# Patient Record
Sex: Female | Born: 1935 | Race: White | Hispanic: No | Marital: Married | Smoking: Former smoker
Health system: Southern US, Community
[De-identification: ages and names within clinical notes are randomized; demographics above are authoritative.]

## PROBLEM LIST (undated history)

## (undated) DIAGNOSIS — M1990 Unspecified osteoarthritis, unspecified site: Secondary | ICD-10-CM

## (undated) DIAGNOSIS — I499 Cardiac arrhythmia, unspecified: Secondary | ICD-10-CM

## (undated) DIAGNOSIS — I10 Essential (primary) hypertension: Secondary | ICD-10-CM

## (undated) DIAGNOSIS — R32 Unspecified urinary incontinence: Secondary | ICD-10-CM

## (undated) DIAGNOSIS — K635 Polyp of colon: Secondary | ICD-10-CM

## (undated) DIAGNOSIS — E039 Hypothyroidism, unspecified: Secondary | ICD-10-CM

## (undated) DIAGNOSIS — K219 Gastro-esophageal reflux disease without esophagitis: Secondary | ICD-10-CM

## (undated) DIAGNOSIS — Z8709 Personal history of other diseases of the respiratory system: Secondary | ICD-10-CM

## (undated) DIAGNOSIS — IMO0001 Reserved for inherently not codable concepts without codable children: Secondary | ICD-10-CM

## (undated) HISTORY — PX: BACK SURGERY: SHX140

## (undated) HISTORY — PX: COLONOSCOPY: SHX174

## (undated) HISTORY — PX: TUMOR REMOVAL: SHX12

## (undated) HISTORY — PX: ESOPHAGOGASTRODUODENOSCOPY: SHX1529

## (undated) HISTORY — PX: CATARACT EXTRACTION: SUR2

---

## 2014-07-12 ENCOUNTER — Other Ambulatory Visit: Payer: Self-pay | Admitting: Neurosurgery

## 2014-07-20 ENCOUNTER — Encounter (HOSPITAL_COMMUNITY)
Admission: RE | Admit: 2014-07-20 | Discharge: 2014-07-20 | Disposition: A | Discharge status: Home / Self Care | Payer: Medicare Other | Source: Ambulatory Visit | Attending: Neurosurgery | Admitting: Neurosurgery

## 2014-07-20 ENCOUNTER — Encounter (HOSPITAL_COMMUNITY): Payer: Self-pay

## 2014-07-20 DIAGNOSIS — M7989 Other specified soft tissue disorders: Secondary | ICD-10-CM | POA: Diagnosis not present

## 2014-07-20 DIAGNOSIS — K219 Gastro-esophageal reflux disease without esophagitis: Secondary | ICD-10-CM | POA: Diagnosis not present

## 2014-07-20 DIAGNOSIS — Z87891 Personal history of nicotine dependence: Secondary | ICD-10-CM | POA: Diagnosis not present

## 2014-07-20 DIAGNOSIS — M5126 Other intervertebral disc displacement, lumbar region: Secondary | ICD-10-CM | POA: Diagnosis not present

## 2014-07-20 DIAGNOSIS — E669 Obesity, unspecified: Secondary | ICD-10-CM | POA: Diagnosis not present

## 2014-07-20 DIAGNOSIS — Z6833 Body mass index (BMI) 33.0-33.9, adult: Secondary | ICD-10-CM | POA: Diagnosis not present

## 2014-07-20 DIAGNOSIS — Z79899 Other long term (current) drug therapy: Secondary | ICD-10-CM | POA: Diagnosis not present

## 2014-07-20 DIAGNOSIS — M1990 Unspecified osteoarthritis, unspecified site: Secondary | ICD-10-CM | POA: Diagnosis not present

## 2014-07-20 DIAGNOSIS — E039 Hypothyroidism, unspecified: Secondary | ICD-10-CM | POA: Diagnosis not present

## 2014-07-20 DIAGNOSIS — R0602 Shortness of breath: Secondary | ICD-10-CM | POA: Diagnosis not present

## 2014-07-20 DIAGNOSIS — I499 Cardiac arrhythmia, unspecified: Secondary | ICD-10-CM | POA: Diagnosis not present

## 2014-07-20 DIAGNOSIS — Z7901 Long term (current) use of anticoagulants: Secondary | ICD-10-CM | POA: Diagnosis not present

## 2014-07-20 DIAGNOSIS — I10 Essential (primary) hypertension: Secondary | ICD-10-CM | POA: Diagnosis not present

## 2014-07-20 DIAGNOSIS — R32 Unspecified urinary incontinence: Secondary | ICD-10-CM | POA: Diagnosis not present

## 2014-07-20 HISTORY — DX: Essential (primary) hypertension: I10

## 2014-07-20 HISTORY — DX: Gastro-esophageal reflux disease without esophagitis: K21.9

## 2014-07-20 HISTORY — DX: Unspecified osteoarthritis, unspecified site: M19.90

## 2014-07-20 HISTORY — DX: Unspecified urinary incontinence: R32

## 2014-07-20 HISTORY — DX: Personal history of other diseases of the respiratory system: Z87.09

## 2014-07-20 HISTORY — DX: Reserved for inherently not codable concepts without codable children: IMO0001

## 2014-07-20 HISTORY — DX: Cardiac arrhythmia, unspecified: I49.9

## 2014-07-20 HISTORY — DX: Hypothyroidism, unspecified: E03.9

## 2014-07-20 HISTORY — DX: Polyp of colon: K63.5

## 2014-07-20 LAB — CBC
HCT: 38 % (ref 36.0–46.0)
Hemoglobin: 12.8 g/dL (ref 12.0–15.0)
MCH: 31 pg (ref 26.0–34.0)
MCHC: 33.7 g/dL (ref 30.0–36.0)
MCV: 92 fL (ref 78.0–100.0)
Platelets: 190 10*3/uL (ref 150–400)
RBC: 4.13 MIL/uL (ref 3.87–5.11)
RDW: 13.8 % (ref 11.5–15.5)
WBC: 4.6 10*3/uL (ref 4.0–10.5)

## 2014-07-20 LAB — BASIC METABOLIC PANEL
Anion gap: 5 (ref 5–15)
BUN: 13 mg/dL (ref 6–20)
CALCIUM: 9.5 mg/dL (ref 8.9–10.3)
CHLORIDE: 107 mmol/L (ref 101–111)
CO2: 29 mmol/L (ref 22–32)
CREATININE: 0.8 mg/dL (ref 0.44–1.00)
GFR calc Af Amer: >60 mL/min (ref >60)
GFR calc non Af Amer: >60 mL/min (ref >60)
GLUCOSE: 97 mg/dL (ref 65–99)
Potassium: 4.3 mmol/L (ref 3.5–5.1)
Sodium: 141 mmol/L (ref 135–145)

## 2014-07-20 LAB — SURGICAL PCR SCREEN
MRSA, PCR: NEGATIVE
Staphylococcus aureus: NEGATIVE

## 2014-07-20 NOTE — Pre-Procedure Instructions (Signed)
    Wendy Mccormick  07/20/2014      [facility] - [place] - [address] [place] [address] Phone: [phone] Fax: [phone]    Your procedure is scheduled on Thursday, July 22, 2014 at 7:30 AM .  Report to [facility] Admitting at 5:30 A.M.  Call this number if you have problems the morning of surgery:  [phone]   Remember:  Do not eat food or drink liquids after midnight.   Take these medicines the morning of surgery with A SIP OF WATER Gabapentin (Neurontin), Levothyroxine (Synthroid), Metoprolol Succinate (Toprol-XL), Pantoprazole (Protonix).  Stop taking: Eliquis, Aspirin, BC's, Goody's, Ibuprofen, Aleve, Fish Oil, herbal medications.    Do not wear jewelry, make-up or nail polish.  Do not wear lotions, powders, or perfumes.  You may wear deodorant.  Do not shave 48 hours prior to surgery.    Do not bring valuables to the hospital.  [facility] is not responsible for any belongings or valuables.  Contacts, dentures or bridgework may not be worn into surgery.  Leave your suitcase in the car.  After surgery it may be brought to your room.  For patients admitted to the hospital, discharge time will be determined by your treatment team.  Patients discharged the day of surgery will not be allowed to drive home.    Special instructions:  See attached.   Please read over the following fact sheets that you were given. Pain Booklet, Coughing and Deep Breathing, MRSA Information and Surgical Site Infection Prevention

## 2014-07-20 NOTE — Progress Notes (Signed)
PCP- Dr. Nadara Eaton  Pt. States that she had an echo, CXR and stress test last August at [facility].  Requested records.   Pt. Unsure of name of cardiologist.

## 2014-07-21 MED ORDER — CEFAZOLIN SODIUM-DEXTROSE 2-3 GM-% IV SOLR
2.0000 g | INTRAVENOUS | Status: AC
  Administered 2014-07-22: 2 g via INTRAVENOUS
  Filled 2014-07-21: qty 50

## 2014-07-21 MED ORDER — DEXAMETHASONE SODIUM PHOSPHATE 10 MG/ML IJ SOLN
10.0000 mg | INTRAMUSCULAR | Status: DC
  Filled 2014-07-21: qty 1

## 2014-07-21 NOTE — Progress Notes (Signed)
Called Dr Lendon Colonel office and re-requested cardiac records. Belenda Cruise stated will refax records to [place].

## 2014-07-21 NOTE — Anesthesia Preprocedure Evaluation (Signed)
Anesthesia Evaluation  Patient identified by MRN, date of birth, ID band Patient awake    Reviewed: Allergy & Precautions, NPO status , Patient's Chart, lab work & pertinent test results, reviewed documented beta blocker date and time   Airway Mallampati: II  TM Distance: >3 FB Neck ROM: Full    Dental no notable dental hx.    Pulmonary neg pulmonary ROS, shortness of breath, former smoker,  breath sounds clear to auscultation  Pulmonary exam normal       Cardiovascular hypertension, Pt. on medications and Pt. on home beta blockers Normal cardiovascular exam+ dysrhythmias Rhythm:Regular Rate:Normal     Neuro/Psych negative neurological ROS  negative psych ROS   GI/Hepatic Neg liver ROS, GERD-  ,  Endo/Other  Hypothyroidism   Renal/GU negative Renal ROS     Musculoskeletal  (+) Arthritis -,   Abdominal (+) + obese,   Peds  Hematology negative hematology ROS (+)   Anesthesia Other Findings   Reproductive/Obstetrics negative OB ROS                             Anesthesia Physical Anesthesia Plan  ASA: III  Anesthesia Plan: General   Post-op Pain Management:    Induction: Intravenous  Airway Management Planned: Oral ETT  Additional Equipment:   Intra-op Plan:   Post-operative Plan: Extubation in OR  Informed Consent: I have reviewed the patients History and Physical, chart, labs and discussed the procedure including the risks, benefits and alternatives for the proposed anesthesia with the patient or authorized representative who has indicated his/her understanding and acceptance.   Dental advisory given  Plan Discussed with: CRNA  Anesthesia Plan Comments:         Anesthesia Quick Evaluation

## 2014-07-22 ENCOUNTER — Encounter (HOSPITAL_COMMUNITY): Payer: Self-pay | Admitting: *Deleted

## 2014-07-22 ENCOUNTER — Ambulatory Visit (HOSPITAL_COMMUNITY): Payer: Medicare Other

## 2014-07-22 ENCOUNTER — Ambulatory Visit (HOSPITAL_COMMUNITY)
Admission: RE | Admit: 2014-07-22 | Discharge: 2014-07-23 | Disposition: A | Discharge status: Home / Self Care | Payer: Medicare Other | Source: Ambulatory Visit | Attending: Neurosurgery | Admitting: Neurosurgery

## 2014-07-22 ENCOUNTER — Ambulatory Visit (HOSPITAL_COMMUNITY): Payer: Medicare Other | Admitting: Anesthesiology

## 2014-07-22 ENCOUNTER — Encounter (HOSPITAL_COMMUNITY)
Admission: RE | Disposition: A | Discharge status: Home / Self Care | Payer: Self-pay | Source: Ambulatory Visit | Attending: Neurosurgery

## 2014-07-22 DIAGNOSIS — Z6833 Body mass index (BMI) 33.0-33.9, adult: Secondary | ICD-10-CM | POA: Insufficient documentation

## 2014-07-22 DIAGNOSIS — Z7901 Long term (current) use of anticoagulants: Secondary | ICD-10-CM | POA: Insufficient documentation

## 2014-07-22 DIAGNOSIS — M1990 Unspecified osteoarthritis, unspecified site: Secondary | ICD-10-CM | POA: Insufficient documentation

## 2014-07-22 DIAGNOSIS — K219 Gastro-esophageal reflux disease without esophagitis: Secondary | ICD-10-CM | POA: Diagnosis not present

## 2014-07-22 DIAGNOSIS — E039 Hypothyroidism, unspecified: Secondary | ICD-10-CM | POA: Diagnosis not present

## 2014-07-22 DIAGNOSIS — R32 Unspecified urinary incontinence: Secondary | ICD-10-CM | POA: Insufficient documentation

## 2014-07-22 DIAGNOSIS — Z419 Encounter for procedure for purposes other than remedying health state, unspecified: Secondary | ICD-10-CM

## 2014-07-22 DIAGNOSIS — I499 Cardiac arrhythmia, unspecified: Secondary | ICD-10-CM | POA: Insufficient documentation

## 2014-07-22 DIAGNOSIS — Z79899 Other long term (current) drug therapy: Secondary | ICD-10-CM | POA: Insufficient documentation

## 2014-07-22 DIAGNOSIS — Z87891 Personal history of nicotine dependence: Secondary | ICD-10-CM | POA: Insufficient documentation

## 2014-07-22 DIAGNOSIS — M5126 Other intervertebral disc displacement, lumbar region: Secondary | ICD-10-CM | POA: Diagnosis present

## 2014-07-22 DIAGNOSIS — M7989 Other specified soft tissue disorders: Secondary | ICD-10-CM | POA: Insufficient documentation

## 2014-07-22 DIAGNOSIS — E669 Obesity, unspecified: Secondary | ICD-10-CM | POA: Insufficient documentation

## 2014-07-22 DIAGNOSIS — R0602 Shortness of breath: Secondary | ICD-10-CM | POA: Insufficient documentation

## 2014-07-22 DIAGNOSIS — I10 Essential (primary) hypertension: Secondary | ICD-10-CM | POA: Diagnosis not present

## 2014-07-22 HISTORY — PX: LUMBAR LAMINECTOMY/DECOMPRESSION MICRODISCECTOMY: SHX5026

## 2014-07-22 SURGERY — LUMBAR LAMINECTOMY/DECOMPRESSION MICRODISCECTOMY 1 LEVEL
Anesthesia: General | Site: Back | Laterality: Left

## 2014-07-22 MED ORDER — MENTHOL 3 MG MT LOZG
1.0000 | LOZENGE | OROMUCOSAL | Status: DC | PRN
  Filled 2014-07-22: qty 9

## 2014-07-22 MED ORDER — EPHEDRINE SULFATE 50 MG/ML IJ SOLN
INTRAMUSCULAR | Status: DC | PRN
  Administered 2014-07-22: 10 mg via INTRAVENOUS
  Administered 2014-07-22: 5 mg via INTRAVENOUS

## 2014-07-22 MED ORDER — ROCURONIUM BROMIDE 100 MG/10ML IV SOLN
INTRAVENOUS | Status: DC | PRN
  Administered 2014-07-22: 40 mg via INTRAVENOUS

## 2014-07-22 MED ORDER — PROMETHAZINE HCL 25 MG/ML IJ SOLN: 6.2500 mg | INTRAMUSCULAR | Status: DC | PRN

## 2014-07-22 MED ORDER — TRAMADOL HCL 50 MG PO TABS
50.0000 mg | ORAL_TABLET | Freq: Four times a day (QID) | ORAL | Status: DC | PRN
  Administered 2014-07-22 – 2014-07-23 (×2): 50 mg via ORAL
  Filled 2014-07-22 (×2): qty 1

## 2014-07-22 MED ORDER — GABAPENTIN 300 MG PO CAPS
300.0000 mg | ORAL_CAPSULE | Freq: Every day | ORAL | Status: DC
  Administered 2014-07-22 – 2014-07-23 (×2): 300 mg via ORAL
  Filled 2014-07-22 (×2): qty 1

## 2014-07-22 MED ORDER — DEXAMETHASONE 4 MG PO TABS
4.0000 mg | ORAL_TABLET | Freq: Four times a day (QID) | ORAL | Status: AC
  Administered 2014-07-22: 4 mg via ORAL
  Filled 2014-07-22: qty 1

## 2014-07-22 MED ORDER — DEXAMETHASONE SODIUM PHOSPHATE 10 MG/ML IJ SOLN
INTRAMUSCULAR | Status: DC | PRN
  Administered 2014-07-22: 10 mg via INTRAVENOUS

## 2014-07-22 MED ORDER — DEXAMETHASONE SODIUM PHOSPHATE 4 MG/ML IJ SOLN
INTRAMUSCULAR | Status: AC
  Filled 2014-07-22: qty 2

## 2014-07-22 MED ORDER — GLYCOPYRROLATE 0.2 MG/ML IJ SOLN
INTRAMUSCULAR | Status: DC | PRN
Start: 2014-07-22 — End: 2014-07-22
  Administered 2014-07-22: 0.4 mg via INTRAVENOUS

## 2014-07-22 MED ORDER — BISACODYL 5 MG PO TBEC
5.0000 mg | DELAYED_RELEASE_TABLET | Freq: Every day | ORAL | Status: DC | PRN
  Filled 2014-07-22: qty 1

## 2014-07-22 MED ORDER — HYDROMORPHONE HCL 1 MG/ML IJ SOLN
INTRAMUSCULAR | Status: AC
  Filled 2014-07-22: qty 1

## 2014-07-22 MED ORDER — ACETAMINOPHEN 650 MG RE SUPP
650.0000 mg | RECTAL | Status: DC | PRN
Start: 2014-07-22 — End: 2014-07-23

## 2014-07-22 MED ORDER — METOPROLOL SUCCINATE ER 100 MG PO TB24
100.0000 mg | ORAL_TABLET | Freq: Every day | ORAL | Status: DC
  Administered 2014-07-23: 100 mg via ORAL
  Filled 2014-07-22: qty 1

## 2014-07-22 MED ORDER — PHENOL 1.4 % MT LIQD: 1.0000 | OROMUCOSAL | Status: DC | PRN

## 2014-07-22 MED ORDER — HEMOSTATIC AGENTS (NO CHARGE) OPTIME
TOPICAL | Status: DC | PRN
  Administered 2014-07-22: 1 via TOPICAL

## 2014-07-22 MED ORDER — MEPERIDINE HCL 25 MG/ML IJ SOLN: 6.2500 mg | INTRAMUSCULAR | Status: DC | PRN

## 2014-07-22 MED ORDER — ROCURONIUM BROMIDE 50 MG/5ML IV SOLN
INTRAVENOUS | Status: AC
  Filled 2014-07-22: qty 1

## 2014-07-22 MED ORDER — FENTANYL CITRATE (PF) 250 MCG/5ML IJ SOLN
INTRAMUSCULAR | Status: AC
  Filled 2014-07-22: qty 5

## 2014-07-22 MED ORDER — SODIUM CHLORIDE 0.9 % IR SOLN
Status: DC | PRN
  Administered 2014-07-22: 500 mL

## 2014-07-22 MED ORDER — LIDOCAINE HCL (CARDIAC) 20 MG/ML IV SOLN
INTRAVENOUS | Status: AC
  Filled 2014-07-22: qty 5

## 2014-07-22 MED ORDER — CEFAZOLIN SODIUM-DEXTROSE 2-3 GM-% IV SOLR
2.0000 g | Freq: Three times a day (TID) | INTRAVENOUS | Status: AC
  Administered 2014-07-22 (×2): 2 g via INTRAVENOUS
  Filled 2014-07-22 (×2): qty 50

## 2014-07-22 MED ORDER — SODIUM CHLORIDE 0.9 % IJ SOLN
3.0000 mL | Freq: Two times a day (BID) | INTRAMUSCULAR | Status: DC
Start: 2014-07-22 — End: 2014-07-23
  Administered 2014-07-22 (×2): 3 mL via INTRAVENOUS

## 2014-07-22 MED ORDER — THROMBIN 5000 UNITS EX SOLR
CUTANEOUS | Status: DC | PRN
  Administered 2014-07-22 (×2): 5000 [IU] via TOPICAL

## 2014-07-22 MED ORDER — OXYCODONE-ACETAMINOPHEN 5-325 MG PO TABS: 1.0000 | ORAL_TABLET | ORAL | Status: DC | PRN

## 2014-07-22 MED ORDER — LIDOCAINE HCL (CARDIAC) 20 MG/ML IV SOLN
INTRAVENOUS | Status: DC | PRN
  Administered 2014-07-22: 100 mg via INTRAVENOUS

## 2014-07-22 MED ORDER — SODIUM CHLORIDE 0.9 % IJ SOLN: 3.0000 mL | INTRAMUSCULAR | Status: DC | PRN

## 2014-07-22 MED ORDER — FENTANYL CITRATE (PF) 100 MCG/2ML IJ SOLN
INTRAMUSCULAR | Status: DC | PRN
  Administered 2014-07-22 (×3): 50 ug via INTRAVENOUS
  Administered 2014-07-22: 100 ug via INTRAVENOUS

## 2014-07-22 MED ORDER — NEOSTIGMINE METHYLSULFATE 10 MG/10ML IV SOLN
INTRAVENOUS | Status: DC | PRN
  Administered 2014-07-22: 3 mg via INTRAVENOUS

## 2014-07-22 MED ORDER — ONDANSETRON HCL 4 MG/2ML IJ SOLN
INTRAMUSCULAR | Status: AC
  Filled 2014-07-22: qty 2

## 2014-07-22 MED ORDER — DEXAMETHASONE SODIUM PHOSPHATE 4 MG/ML IJ SOLN
4.0000 mg | Freq: Four times a day (QID) | INTRAMUSCULAR | Status: AC
  Administered 2014-07-22: 4 mg via INTRAVENOUS
  Filled 2014-07-22: qty 1

## 2014-07-22 MED ORDER — ACETAMINOPHEN 325 MG PO TABS: 650.0000 mg | ORAL_TABLET | ORAL | Status: DC | PRN

## 2014-07-22 MED ORDER — ONDANSETRON HCL 4 MG/2ML IJ SOLN: 4.0000 mg | INTRAMUSCULAR | Status: DC | PRN

## 2014-07-22 MED ORDER — BUPIVACAINE HCL (PF) 0.5 % IJ SOLN
INTRAMUSCULAR | Status: DC | PRN
  Administered 2014-07-22: 20 mL

## 2014-07-22 MED ORDER — LACTATED RINGERS IV SOLN
INTRAVENOUS | Status: DC | PRN
  Administered 2014-07-22 (×2): via INTRAVENOUS

## 2014-07-22 MED ORDER — PROPOFOL 10 MG/ML IV BOLUS
INTRAVENOUS | Status: AC
  Filled 2014-07-22: qty 20

## 2014-07-22 MED ORDER — HYDROMORPHONE HCL 1 MG/ML IJ SOLN: 1.0000 mg | INTRAMUSCULAR | Status: DC | PRN

## 2014-07-22 MED ORDER — 0.9 % SODIUM CHLORIDE (POUR BTL) OPTIME
TOPICAL | Status: DC | PRN
  Administered 2014-07-22: 1000 mL

## 2014-07-22 MED ORDER — PANTOPRAZOLE SODIUM 40 MG PO TBEC
40.0000 mg | DELAYED_RELEASE_TABLET | Freq: Every day | ORAL | Status: DC
  Administered 2014-07-22: 40 mg via ORAL

## 2014-07-22 MED ORDER — KCL IN DEXTROSE-NACL 20-5-0.45 MEQ/L-%-% IV SOLN
80.0000 mL/h | INTRAVENOUS | Status: DC
  Filled 2014-07-22 (×4): qty 1000

## 2014-07-22 MED ORDER — HYDROMORPHONE HCL 1 MG/ML IJ SOLN
0.2500 mg | INTRAMUSCULAR | Status: DC | PRN
  Administered 2014-07-22 (×4): 0.5 mg via INTRAVENOUS

## 2014-07-22 MED ORDER — LEVOTHYROXINE SODIUM 125 MCG PO TABS
125.0000 ug | ORAL_TABLET | Freq: Every day | ORAL | Status: DC
  Administered 2014-07-23: 125 ug via ORAL
  Filled 2014-07-22 (×2): qty 1

## 2014-07-22 MED ORDER — PROPOFOL 10 MG/ML IV BOLUS
INTRAVENOUS | Status: DC | PRN
  Administered 2014-07-22: 160 mg via INTRAVENOUS

## 2014-07-22 MED ORDER — PANTOPRAZOLE SODIUM 40 MG IV SOLR
40.0000 mg | Freq: Every day | INTRAVENOUS | Status: DC
  Filled 2014-07-22: qty 40

## 2014-07-22 MED ORDER — METHYLPREDNISOLONE ACETATE 80 MG/ML IJ SUSP
INTRAMUSCULAR | Status: DC | PRN
  Administered 2014-07-22: 80 mg

## 2014-07-22 MED ORDER — KETOROLAC TROMETHAMINE 30 MG/ML IJ SOLN
15.0000 mg | Freq: Four times a day (QID) | INTRAMUSCULAR | Status: DC
  Administered 2014-07-22 – 2014-07-23 (×4): 15 mg via INTRAVENOUS
  Filled 2014-07-22 (×7): qty 1

## 2014-07-22 SURGICAL SUPPLY — 51 items
BAG DECANTER FOR FLEXI CONT (MISCELLANEOUS) ×3 IMPLANT
BENZOIN TINCTURE PRP APPL 2/3 (GAUZE/BANDAGES/DRESSINGS) ×3 IMPLANT
BLADE CLIPPER SURG (BLADE) IMPLANT
BRUSH SCRUB EZ PLAIN DRY (MISCELLANEOUS) ×3 IMPLANT
BUR CUTTER 7.0 ROUND (BURR) ×3 IMPLANT
CANISTER SUCT 3000ML PPV (MISCELLANEOUS) ×3 IMPLANT
CLOSURE WOUND 1/2 X4 (GAUZE/BANDAGES/DRESSINGS) ×2
CONT SPEC 4OZ CLIKSEAL STRL BL (MISCELLANEOUS) IMPLANT
DRAPE LAPAROTOMY 100X72X124 (DRAPES) ×3 IMPLANT
DRAPE MICROSCOPE LEICA (MISCELLANEOUS) ×3 IMPLANT
DRAPE POUCH INSTRU U-SHP 10X18 (DRAPES) IMPLANT
DRAPE SURG 17X23 STRL (DRAPES) ×6 IMPLANT
DRSG OPSITE POSTOP 4X6 (GAUZE/BANDAGES/DRESSINGS) ×3 IMPLANT
DRSG TELFA 3X8 NADH (GAUZE/BANDAGES/DRESSINGS) IMPLANT
ELECT REM PT RETURN 9FT ADLT (ELECTROSURGICAL) ×3
ELECTRODE REM PT RTRN 9FT ADLT (ELECTROSURGICAL) ×1 IMPLANT
GAUZE SPONGE 4X4 12PLY STRL (GAUZE/BANDAGES/DRESSINGS) IMPLANT
GAUZE SPONGE 4X4 16PLY XRAY LF (GAUZE/BANDAGES/DRESSINGS) IMPLANT
GLOVE BIOGEL PI IND STRL 7.0 (GLOVE) ×1 IMPLANT
GLOVE BIOGEL PI IND STRL 7.5 (GLOVE) ×1 IMPLANT
GLOVE BIOGEL PI INDICATOR 7.0 (GLOVE) ×2
GLOVE BIOGEL PI INDICATOR 7.5 (GLOVE) ×2
GLOVE ECLIPSE 6.5 STRL STRAW (GLOVE) ×3 IMPLANT
GLOVE ECLIPSE 7.0 STRL STRAW (GLOVE) ×3 IMPLANT
GLOVE ECLIPSE 8.0 STRL XLNG CF (GLOVE) ×6 IMPLANT
GOWN STRL REUS W/ TWL LRG LVL3 (GOWN DISPOSABLE) ×2 IMPLANT
GOWN STRL REUS W/ TWL XL LVL3 (GOWN DISPOSABLE) ×1 IMPLANT
GOWN STRL REUS W/TWL 2XL LVL3 (GOWN DISPOSABLE) IMPLANT
GOWN STRL REUS W/TWL LRG LVL3 (GOWN DISPOSABLE) ×4
GOWN STRL REUS W/TWL XL LVL3 (GOWN DISPOSABLE) ×2
KIT BASIN OR (CUSTOM PROCEDURE TRAY) ×3 IMPLANT
KIT ROOM TURNOVER OR (KITS) ×3 IMPLANT
LIQUID BAND (GAUZE/BANDAGES/DRESSINGS) ×3 IMPLANT
NEEDLE HYPO 22GX1.5 SAFETY (NEEDLE) ×3 IMPLANT
NEEDLE SPNL 22GX3.5 QUINCKE BK (NEEDLE) ×3 IMPLANT
NS IRRIG 1000ML POUR BTL (IV SOLUTION) ×3 IMPLANT
PACK LAMINECTOMY NEURO (CUSTOM PROCEDURE TRAY) ×3 IMPLANT
PAD ARMBOARD 7.5X6 YLW CONV (MISCELLANEOUS) ×9 IMPLANT
PATTIES SURGICAL .75X.75 (GAUZE/BANDAGES/DRESSINGS) IMPLANT
RUBBERBAND STERILE (MISCELLANEOUS) ×6 IMPLANT
SPONGE SURGIFOAM ABS GEL SZ50 (HEMOSTASIS) ×3 IMPLANT
STAPLER SKIN PROX WIDE 3.9 (STAPLE) IMPLANT
STRIP CLOSURE SKIN 1/2X4 (GAUZE/BANDAGES/DRESSINGS) ×4 IMPLANT
SUT PROLENE 0 CT 1 30 (SUTURE) ×3 IMPLANT
SUT VIC AB 2-0 OS6 18 (SUTURE) ×9 IMPLANT
SUT VIC AB 3-0 CP2 18 (SUTURE) ×3 IMPLANT
SYR 20ML ECCENTRIC (SYRINGE) ×3 IMPLANT
TOOL FLUTED BALL 7MM (MISCELLANEOUS) ×3 IMPLANT
TOWEL OR 17X24 6PK STRL BLUE (TOWEL DISPOSABLE) ×3 IMPLANT
TOWEL OR 17X26 10 PK STRL BLUE (TOWEL DISPOSABLE) ×3 IMPLANT
WATER STERILE IRR 1000ML POUR (IV SOLUTION) ×3 IMPLANT

## 2014-07-22 NOTE — Op Note (Signed)
Preop diagnosis: Herniated disc L4-5 left, extraforaminal Postop diagnosis: Same Procedure: Left L4-5 extraforaminal discectomy for decompression of L4 nerve root Surgeon: Ryenn Howeth Asst.: Nundkumar  After being placed the prone position the patient's back was prepped and draped in the usual sterile fashion. Localizing x-rays taken prior to incision to identify the appropriate level. Midline incision was made above the spinous processes of L4 and L5. Using Bovie cutting current the incision was carried on the spinous processes. Subperiosteal dissection was then carried out on the left side on the spinous processes lamina facet joint and the far lateral region to identify the transverse processes of L4 and L5. Self-retaining tract was placed for exposure and x-ray showed approach the appropriate level. The microscope scope was draped brought in the field and used for the remainder of the case. Using microsecond technique we dissected down through the soft tissue lateral to the facet joint. Removed some of the lateral facet joint at L4-5. We incised the intertransverse ligament and then the soft tissue. We dissected further down and identify the L4 nerve root. Medial to the nerve root slightly superior to the disc space we encountered large fragments of disc material which were removed to get progressive decompression of the L4 nerve root. We then able to mobilize it slightly work underneath it in all directions to get an excellent cleanout of the disc all rounded until was well decompressed. Removed a small medical disc from the disc space itself. Inspected once more for any evidence of residual compression and none could be identified. Irrigation was carried out and any bleeding control proper coagulation Gelfoam. Depo-Medrol was placed around the nerve root. The was then closed in multiple layers of Vicryls on the fascia subcutaneous and subcuticular tissues. Dermabond Steri-Strips were placed on the skin. A  sterile dressing was then applied and the patient was extubated and taken to recovery in stable condition.

## 2014-07-22 NOTE — Transfer of Care (Signed)
Immediate Anesthesia Transfer of Care Note  Patient: Wendy Mccormick  Procedure(s) Performed: Procedure(s): Microdiscectomy - left - L4-L5 extra foraminal (Left)  Patient Location: PACU  Anesthesia Type:General  Level of Consciousness: awake, alert , oriented and patient cooperative  Airway & Oxygen Therapy: Patient Spontanous Breathing and Patient connected to face mask oxygen  Post-op Assessment: Report given to RN and Post -op Vital signs reviewed and stable  Post vital signs: Reviewed and stable  Last Vitals:  Filed Vitals:   07/22/14 1100  BP: 127/82  Pulse: 60  Temp:   Resp: 27    Complications: No apparent anesthesia complications

## 2014-07-22 NOTE — Plan of Care (Signed)
Problem: Consults Goal: Diagnosis - Spinal Surgery Outcome: Completed/Met Date Met:  07/22/14 Microdiscectomy

## 2014-07-22 NOTE — H&P (Signed)
Wendy Mccormick is an 81 y.o. female.   Chief Complaint: Left leg pain HPI: The patient is an 81 year old female who is evaluated in the office for back pain radiating down the left leg. She's had discomfort number of months there is no inciting event. She saw her medical doctor was given prednisone then [facility] pain specialist who felt that injections would not be beneficial. An MRI scan was done which showed a large disc herniation. Starting to inflammatory medications muscle relaxants without relief came for evaluation. After evaluation the office the options were discussed. A large extraforaminal disc L4-5 on the left with marked L4 nerve root compression was identified. The patient requested surgery now comes for an extraforaminal lumbar microdiscectomy. I had a long discussion with her regarding the risks and benefits of surgical intervention. The risks discussed include but are not limited to bleeding infection weakness some as paralysis spinal fluid leak coma and death. We have discussed alternative methods of therapy along with risks and benefits of nonintervention. She's had the opportunity to ask numerous questions and appears to understand. With this information in hand she has requested we proceed with surgery.  Past Medical History  Diagnosis Date  . Urinary incontinence   . Dysrhythmia   . Hypertension     metoprolol  . Hypothyroidism   . History of bronchitis   . Shortness of breath dyspnea   . GERD (gastroesophageal reflux disease)   . Arthritis   . Colon polyps     Past Surgical History  Procedure Laterality Date  . 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 Cataract extraction Bilateral   . Back surgery    . Colonoscopy    . Esophagogastroduodenoscopy      History reviewed. No pertinent family history. Social History:  reports that she quit smoking about 36 years ago. She does not have any smokeless tobacco history on file. She reports that she  does not drink alcohol or use illicit drugs.  Allergies:  Allergies  Allergen Reactions  . Hydrocodone Nausea And Vomiting    Medications Prior to Admission  Medication Sig Dispense Refill  . apixaban (ELIQUIS) 5 MG TABS tablet Take 5 mg by mouth 2 (two) times daily.    Marland Kitchen gabapentin (NEURONTIN) 300 MG capsule Take 300 mg by mouth daily.    Marland Kitchen levothyroxine (SYNTHROID, LEVOTHROID) 125 MCG tablet Take 125 mcg by mouth daily.    . metoprolol succinate (TOPROL-XL) 100 MG 24 hr tablet Take 100 mg by mouth daily.    . pantoprazole (PROTONIX) 40 MG tablet Take 40 mg by mouth daily.      Results for orders placed or performed during the hospital encounter of 07/20/14 (from the past 48 hour(s))  CBC     Status: None   Collection Time: 07/20/14  8:50 AM  Result Value Ref Range   WBC 4.6 4.0 - 10.5 K/uL   RBC 4.13 3.87 - 5.11 MIL/uL   Hemoglobin 12.8 12.0 - 15.0 g/dL   HCT 38.0 36.0 - 46.0 %   MCV 92.0 78.0 - 100.0 fL   MCH 31.0 26.0 - 34.0 pg   MCHC 33.7 30.0 - 36.0 g/dL   RDW 13.8 11.5 - 15.5 %   Platelets 190 150 - 400 K/uL  Basic metabolic panel     Status: None   Collection Time: 07/20/14  8:50 AM  Result Value Ref Range   Sodium 141 135 - 145 mmol/L   Potassium  4.3 3.5 - 5.1 mmol/L   Chloride 107 101 - 111 mmol/L   CO2 29 22 - 32 mmol/L   Glucose, Bld 97 65 - 99 mg/dL   BUN 13 6 - 20 mg/dL   Creatinine, Ser 0.80 0.44 - 1.00 mg/dL   Calcium 9.5 8.9 - 10.3 mg/dL   GFR calc non Af Amer >60 >60 mL/min   GFR calc Af Amer >60 >60 mL/min    Comment: (NOTE) The eGFR has been calculated using the CKD EPI equation. This calculation has not been validated in all clinical situations. eGFR's persistently <60 mL/min signify possible Chronic Kidney Disease.    Anion gap 5 5 - 15  Surgical pcr screen     Status: None   Collection Time: 07/20/14  8:50 AM  Result Value Ref Range   MRSA, PCR NEGATIVE NEGATIVE   Staphylococcus aureus NEGATIVE NEGATIVE    Comment:        The Xpert SA  Assay (FDA approved for NASAL specimens in patients over 2 years of age), is one component of a comprehensive surveillance program.  Test performance has been validated by [facility] for patients greater than or equal to 28 year old. It is not intended to diagnose infection nor to guide or monitor treatment.    No results found.  sitive for incontinence and swelling in her feet  Blood pressure 170/84, pulse 71, temperature 98.1 F (36.7 C), temperature source Oral, resp. rate 20, height '5\' 5"'  (1.651 m), weight 91.627 kg (202 lb), SpO2 97 %.  The patient is awake alert and oriented. She is no facial asymmetry. She has decreased reflexes diffusely. She has some mild quadriceps weakness on the left. Sensation is intact Assessment/Plan Impression is that of an extraforaminal disc at L4-5 with L4 nerve root compression. The plan is for an L4-5 left extraforaminal discectomy  Faythe Ghee, MD 07/22/2014, 7:32 AM

## 2014-07-23 ENCOUNTER — Encounter (HOSPITAL_COMMUNITY): Payer: Self-pay | Admitting: Neurosurgery

## 2014-07-23 DIAGNOSIS — M5126 Other intervertebral disc displacement, lumbar region: Secondary | ICD-10-CM | POA: Diagnosis not present

## 2014-07-23 NOTE — Progress Notes (Signed)
Order received to discharge patient home per MD. RN will discharge patient per protocol and give discharge instructions. Wendy Mccormick

## 2014-07-23 NOTE — Anesthesia Postprocedure Evaluation (Signed)
Anesthesia Post Note  Patient: Wendy Mccormick  Procedure(s) Performed: Procedure(s) (LRB): Microdiscectomy - left - L4-L5 extra foraminal (Left)  Anesthesia type: General  Patient location: PACU  Post pain: Pain level controlled  Post assessment: Post-op Vital signs reviewed  Last Vitals: BP 150/75 mmHg  Pulse 79  Temp(Src) 36.7 C (Oral)  Resp 20  Ht 5\' 5"  (1.651 m)  Wt 202 lb (91.627 kg)  BMI 33.61 kg/m2  SpO2 99%  Post vital signs: Reviewed  Level of consciousness: sedated  Complications: No apparent anesthesia complications

## 2014-07-23 NOTE — Progress Notes (Signed)
Patient alert and oriented, mae's well, voiding adequate amount of urine, swallowing without difficulty, c/o mild pain and medication given prior to discharged.. Patient discharged home with family. Script and discharged instructions given to patient. Patient and family stated understanding of d/c instructions given and has an appointment with MD. 

## 2015-03-12 ENCOUNTER — Inpatient Hospital Stay (HOSPITAL_BASED_OUTPATIENT_CLINIC_OR_DEPARTMENT_OTHER)
Admission: EM | Admit: 2015-03-12 | Discharge: 2015-03-17 | DRG: 099 | Disposition: A | Discharge status: Home / Self Care | Payer: Medicare Other | Attending: Internal Medicine | Admitting: Internal Medicine

## 2015-03-12 ENCOUNTER — Emergency Department (HOSPITAL_BASED_OUTPATIENT_CLINIC_OR_DEPARTMENT_OTHER): Payer: Medicare Other

## 2015-03-12 ENCOUNTER — Encounter (HOSPITAL_BASED_OUTPATIENT_CLINIC_OR_DEPARTMENT_OTHER): Payer: Self-pay | Admitting: Emergency Medicine

## 2015-03-12 DIAGNOSIS — R32 Unspecified urinary incontinence: Secondary | ICD-10-CM | POA: Diagnosis present

## 2015-03-12 DIAGNOSIS — K219 Gastro-esophageal reflux disease without esophagitis: Secondary | ICD-10-CM | POA: Diagnosis present

## 2015-03-12 DIAGNOSIS — G030 Nonpyogenic meningitis: Principal | ICD-10-CM | POA: Diagnosis present

## 2015-03-12 DIAGNOSIS — R509 Fever, unspecified: Secondary | ICD-10-CM | POA: Diagnosis present

## 2015-03-12 DIAGNOSIS — R51 Headache: Secondary | ICD-10-CM

## 2015-03-12 DIAGNOSIS — E039 Hypothyroidism, unspecified: Secondary | ICD-10-CM | POA: Diagnosis present

## 2015-03-12 DIAGNOSIS — G039 Meningitis, unspecified: Secondary | ICD-10-CM

## 2015-03-12 DIAGNOSIS — Z87891 Personal history of nicotine dependence: Secondary | ICD-10-CM

## 2015-03-12 DIAGNOSIS — Z6833 Body mass index (BMI) 33.0-33.9, adult: Secondary | ICD-10-CM

## 2015-03-12 DIAGNOSIS — I4891 Unspecified atrial fibrillation: Secondary | ICD-10-CM | POA: Diagnosis present

## 2015-03-12 DIAGNOSIS — Z7901 Long term (current) use of anticoagulants: Secondary | ICD-10-CM

## 2015-03-12 DIAGNOSIS — E669 Obesity, unspecified: Secondary | ICD-10-CM | POA: Diagnosis present

## 2015-03-12 DIAGNOSIS — R519 Headache, unspecified: Secondary | ICD-10-CM | POA: Diagnosis present

## 2015-03-12 DIAGNOSIS — I10 Essential (primary) hypertension: Secondary | ICD-10-CM | POA: Diagnosis present

## 2015-03-12 DIAGNOSIS — Z86010 Personal history of colonic polyps: Secondary | ICD-10-CM

## 2015-03-12 DIAGNOSIS — I482 Chronic atrial fibrillation: Secondary | ICD-10-CM | POA: Diagnosis present

## 2015-03-12 DIAGNOSIS — M1990 Unspecified osteoarthritis, unspecified site: Secondary | ICD-10-CM | POA: Diagnosis present

## 2015-03-12 MED ORDER — SODIUM CHLORIDE 0.9 % IV BOLUS (SEPSIS)
1000.0000 mL | Freq: Once | INTRAVENOUS | Status: AC
  Administered 2015-03-13: 1000 mL via INTRAVENOUS

## 2015-03-12 MED ORDER — ONDANSETRON HCL 4 MG/2ML IJ SOLN
4.0000 mg | Freq: Once | INTRAMUSCULAR | Status: AC
  Administered 2015-03-13: 4 mg via INTRAVENOUS
  Filled 2015-03-12: qty 2

## 2015-03-12 MED ORDER — MORPHINE SULFATE (PF) 4 MG/ML IV SOLN
4.0000 mg | Freq: Once | INTRAVENOUS | Status: AC
  Administered 2015-03-13: 4 mg via INTRAVENOUS
  Filled 2015-03-12: qty 1

## 2015-03-12 NOTE — ED Notes (Signed)
Pt in c/o neck pain onset x 3 days ago, states BP has been elevated and has had low grade fever as well.

## 2015-03-12 NOTE — ED Notes (Signed)
Pt to CT via stretcher, alert, NAD, calm, interactive, no dyspnea noted. Family at [facility].

## 2015-03-12 NOTE — ED Provider Notes (Signed)
CSN: [id number]     Arrival date & time 03/12/15  2327 History  By signing my name below, I, Freida Busman, attest that this documentation has been prepared under the direction and in the presence of Tomasita Crumble, MD . Electronically Signed: Freida Busman, Scribe. 03/12/2015. 12:09 AM.    Chief Complaint  Patient presents with  . Neck Pain     The history is provided by the patient. No language interpreter was used.     HPI Comments:  Wendy Mccormick is a 82 y.o. female with a history of HTN, who presents to the Emergency Department complaining of 10/10 neck pain x 5 days. Her pain is exacerbated with movement. She reports associated fever with max temp of 101.6. Pt also reports mild HA; notes BP has been elevated. Pt denies recent vomiting, diarrhea, cough, or sneezing. She was evaluated by PCP 3 days ago and was advised to take home anti-inflammatory which she did without relief. She notes her pain at that time was not as bad as her pain today. Pt is currently on eliquis.   Past Medical History  Diagnosis Date  . Urinary incontinence   . Dysrhythmia   . Hypertension     metoprolol  . Hypothyroidism   . History of bronchitis   . Shortness of breath dyspnea   . GERD (gastroesophageal reflux disease)   . Arthritis   . Colon polyps    Past Surgical History  Procedure Laterality Date  . 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 Cataract extraction Bilateral   . Back surgery    . Colonoscopy    . Esophagogastroduodenoscopy    . Lumbar laminectomy/decompression microdiscectomy Left 07/22/2014    Procedure: Microdiscectomy - left - L4-L5 extra foraminal;  Surgeon: Aliene Beams, MD;  Location: [facility];  Service: Neurosurgery;  Laterality: Left;   History reviewed. No pertinent family history. Social History  Substance Use Topics  . Smoking status: Former Smoker    Quit date: 02/05/1978  . Smokeless tobacco: None  . Alcohol Use: No   OB History    No data  available     Review of Systems  10 systems reviewed and all are negative for acute change except as noted in the HPI.  Allergies  Hydrocodone  Home Medications   Prior to Admission medications   Medication Sig Start Date End Date Taking? Authorizing Provider  apixaban (ELIQUIS) 5 MG TABS tablet Take 5 mg by mouth 2 (two) times daily.    Historical Provider, MD  gabapentin (NEURONTIN) 300 MG capsule Take 300 mg by mouth daily.    Historical Provider, MD  levothyroxine (SYNTHROID, LEVOTHROID) 125 MCG tablet Take 125 mcg by mouth daily.    Historical Provider, MD  metoprolol succinate (TOPROL-XL) 100 MG 24 hr tablet Take 100 mg by mouth daily.    Historical Provider, MD  pantoprazole (PROTONIX) 40 MG tablet Take 40 mg by mouth daily.    Historical Provider, MD   BP 170/94 mmHg  Pulse 86  Temp(Src) 99 F (37.2 C) (Oral)  Resp 20  SpO2 99% Physical Exam  Constitutional: She is oriented to person, place, and time. She appears well-developed and well-nourished. No distress.  HENT:  Head: Normocephalic and atraumatic.  Nose: Nose normal.  Mouth/Throat: Oropharynx is clear and moist. No oropharyngeal exudate.  Eyes: Conjunctivae and EOM are normal. Pupils are equal, round, and reactive to light. No scleral icterus.  Neck: No JVD present. No  tracheal deviation present. No thyromegaly present.  Neck stiffness Meningismus  Cardiovascular: Normal rate, regular rhythm and normal heart sounds.  Exam reveals no gallop and no friction rub.   No murmur heard. Pulmonary/Chest: Effort normal and breath sounds normal. No respiratory distress. She has no wheezes. She exhibits no tenderness.  Abdominal: Soft. Bowel sounds are normal. She exhibits no distension and no mass. There is no tenderness. There is no rebound and no guarding.  Musculoskeletal: Normal range of motion. She exhibits no edema or tenderness.  Lymphadenopathy:    She has no cervical adenopathy.  Neurological: She is alert and  oriented to person, place, and time. No cranial nerve deficit. She exhibits normal muscle tone.  Normal strength and sensation to all extremities. Normal cerebellar testing  Skin: Skin is warm and dry. No rash noted. No erythema. No pallor.  Nursing note and vitals reviewed.   ED Course  .Lumbar Puncture Date/Time: 03/13/2015 2:09 AM Performed by: Tomasita Crumble Authorized by: Tomasita Crumble Consent: Verbal consent obtained. Risks and benefits: risks, benefits and alternatives were discussed Consent given by: patient Patient understanding: patient states understanding of the procedure being performed Relevant documents: relevant documents present and verified Test results: test results available and properly labeled Site marked: the operative site was marked Imaging studies: imaging studies available Patient identity confirmed: verbally with patient and hospital-assigned identification number Time out: Immediately prior to procedure a "time out" was called to verify the correct patient, procedure, equipment, support staff and site/side marked as required. Indications: evaluation for infection Local anesthetic: lidocaine 1% with epinephrine Anesthetic total: 7 ml Patient sedated: no Preparation: Patient was prepped and draped in the usual sterile fashion. Lumbar space: L3-L4 interspace Patient's position: right lateral decubitus Needle gauge: 20 Needle type: spinal needle - Quincke tip Needle length: 3.5 in Number of attempts: 4 Post-procedure: site cleaned, pressure dressing applied and adhesive bandage applied Patient tolerance: Patient tolerated the procedure well with no immediate complications Comments: Failed attempt     DIAGNOSTIC STUDIES:  Oxygen Saturation is 99% on RA, normal by my interpretation.    COORDINATION OF CARE:  11:49 PM Discussed treatment plan with pt at bedside and pt agreed to plan.   Labs Review Labs Reviewed  CBC WITH DIFFERENTIAL/PLATELET -  Abnormal; Notable for the following:    Hemoglobin 11.8 (*)    All other components within normal limits  PROTIME-INR - Abnormal; Notable for the following:    Prothrombin Time 16.6 (*)    All other components within normal limits  BASIC METABOLIC PANEL - Abnormal; Notable for the following:    Glucose, Bld 110 (*)    Calcium 8.4 (*)    All other components within normal limits  APTT - Abnormal; Notable for the following:    aPTT 44 (*)    All other components within normal limits  CSF CULTURE  GRAM STAIN  URINALYSIS, ROUTINE W REFLEX MICROSCOPIC (NOT AT [facility])  CSF CELL COUNT WITH DIFFERENTIAL  GLUCOSE, CSF  PROTEIN, CSF  CSF CELL COUNT WITH DIFFERENTIAL    Imaging Review Ct Head Wo Contrast  03/13/2015  CLINICAL DATA:  Headache and fever.  Concern for meningitis. EXAM: CT HEAD WITHOUT CONTRAST TECHNIQUE: Contiguous axial images were obtained from the base of the skull through the vertex without intravenous contrast. COMPARISON:  None. FINDINGS: Skull and Sinuses:Negative for fracture or destructive process. No acute sinusitis or mastoiditis where visualized. TMJ osteoarthritis. Visualized orbits: Bilateral cataract resection.  No acute finding. Brain: No evidence of  acute infarction, hemorrhage, hydrocephalus, or mass lesion/mass effect. No evidence of sulcal or ventricular debris. Age congruent small-vessel ischemic change in the deep cerebral white matter. IMPRESSION: Unremarkable head CT for age. Electronically Signed   By: Marnee Spring M.D.   On: 03/13/2015 00:06   I have personally reviewed and evaluated these images and lab results as part of my medical decision-making.   EKG Interpretation None      MDM   Final diagnoses:  None     patient presents emergency department for stiff neck, headache, and fever to101.3.   She has no other evidence of infectious etiology. I have concern for meningitis. I attempted lumbar puncture but as unsuccessful. She does have recent  lumbar surgery which made this more difficult.I spoke with Dr. Roseanne Reno with neurology who agrees to transfer the patient to [facility] for admission and LP done under iR guidance. Patient was given vancomycin and ceftriaxone for treatment.  Dr. Antionette Char with [facility] hospital[facility] accepts the patient for transfer.   I personally performed the services described in this documentation, which was scribed in my presence. The recorded information has been reviewed and is accurate.     Tomasita Crumble, MD 03/13/15 [phone]

## 2015-03-13 ENCOUNTER — Encounter (HOSPITAL_COMMUNITY): Payer: Self-pay | Admitting: *Deleted

## 2015-03-13 DIAGNOSIS — R32 Unspecified urinary incontinence: Secondary | ICD-10-CM | POA: Diagnosis present

## 2015-03-13 DIAGNOSIS — R51 Headache: Secondary | ICD-10-CM

## 2015-03-13 DIAGNOSIS — Z7901 Long term (current) use of anticoagulants: Secondary | ICD-10-CM | POA: Diagnosis not present

## 2015-03-13 DIAGNOSIS — R509 Fever, unspecified: Secondary | ICD-10-CM | POA: Diagnosis present

## 2015-03-13 DIAGNOSIS — I10 Essential (primary) hypertension: Secondary | ICD-10-CM | POA: Diagnosis present

## 2015-03-13 DIAGNOSIS — G030 Nonpyogenic meningitis: Secondary | ICD-10-CM | POA: Diagnosis present

## 2015-03-13 DIAGNOSIS — G039 Meningitis, unspecified: Secondary | ICD-10-CM | POA: Diagnosis not present

## 2015-03-13 DIAGNOSIS — I482 Chronic atrial fibrillation: Secondary | ICD-10-CM | POA: Diagnosis present

## 2015-03-13 DIAGNOSIS — Z87891 Personal history of nicotine dependence: Secondary | ICD-10-CM | POA: Diagnosis not present

## 2015-03-13 DIAGNOSIS — M1990 Unspecified osteoarthritis, unspecified site: Secondary | ICD-10-CM | POA: Diagnosis present

## 2015-03-13 DIAGNOSIS — G4489 Other headache syndrome: Secondary | ICD-10-CM

## 2015-03-13 DIAGNOSIS — R519 Headache, unspecified: Secondary | ICD-10-CM | POA: Diagnosis present

## 2015-03-13 DIAGNOSIS — E039 Hypothyroidism, unspecified: Secondary | ICD-10-CM | POA: Diagnosis present

## 2015-03-13 DIAGNOSIS — I4891 Unspecified atrial fibrillation: Secondary | ICD-10-CM | POA: Diagnosis present

## 2015-03-13 DIAGNOSIS — Z86010 Personal history of colonic polyps: Secondary | ICD-10-CM | POA: Diagnosis not present

## 2015-03-13 DIAGNOSIS — K219 Gastro-esophageal reflux disease without esophagitis: Secondary | ICD-10-CM | POA: Diagnosis present

## 2015-03-13 DIAGNOSIS — Z6833 Body mass index (BMI) 33.0-33.9, adult: Secondary | ICD-10-CM | POA: Diagnosis not present

## 2015-03-13 DIAGNOSIS — E669 Obesity, unspecified: Secondary | ICD-10-CM | POA: Diagnosis present

## 2015-03-13 LAB — CBC WITH DIFFERENTIAL/PLATELET
BASOS ABS: 0 10*3/uL (ref 0.0–0.1)
BASOS PCT: 0 %
EOS PCT: 1 %
Eosinophils Absolute: 0.1 10*3/uL (ref 0.0–0.7)
HCT: 36.8 % (ref 36.0–46.0)
Hemoglobin: 11.8 g/dL — ABNORMAL LOW (ref 12.0–15.0)
LYMPHS PCT: 14 %
Lymphs Abs: 0.8 10*3/uL (ref 0.7–4.0)
MCH: 30.1 pg (ref 26.0–34.0)
MCHC: 32.1 g/dL (ref 30.0–36.0)
MCV: 93.9 fL (ref 78.0–100.0)
Monocytes Absolute: 0.5 10*3/uL (ref 0.1–1.0)
Monocytes Relative: 9 %
Neutro Abs: 4.2 10*3/uL (ref 1.7–7.7)
Neutrophils Relative %: 76 %
PLATELETS: 151 10*3/uL (ref 150–400)
RBC: 3.92 MIL/uL (ref 3.87–5.11)
RDW: 13 % (ref 11.5–15.5)
WBC: 5.6 10*3/uL (ref 4.0–10.5)

## 2015-03-13 LAB — BASIC METABOLIC PANEL
ANION GAP: 11 (ref 5–15)
Anion gap: 8 (ref 5–15)
BUN: 12 mg/dL (ref 6–20)
BUN: 9 mg/dL (ref 6–20)
CHLORIDE: 102 mmol/L (ref 101–111)
CO2: 25 mmol/L (ref 22–32)
CO2: 25 mmol/L (ref 22–32)
CREATININE: 0.85 mg/dL (ref 0.44–1.00)
Calcium: 8.4 mg/dL — ABNORMAL LOW (ref 8.9–10.3)
Calcium: 8.4 mg/dL — ABNORMAL LOW (ref 8.9–10.3)
Chloride: 105 mmol/L (ref 101–111)
Creatinine, Ser: 0.85 mg/dL (ref 0.44–1.00)
GFR calc Af Amer: >60 mL/min (ref >60)
GFR calc Af Amer: >60 mL/min (ref >60)
GFR calc non Af Amer: >60 mL/min (ref >60)
Glucose, Bld: 110 mg/dL — ABNORMAL HIGH (ref 65–99)
Glucose, Bld: 109 mg/dL — ABNORMAL HIGH (ref 65–99)
POTASSIUM: 3.9 mmol/L (ref 3.5–5.1)
POTASSIUM: 3.9 mmol/L (ref 3.5–5.1)
SODIUM: 138 mmol/L (ref 135–145)
Sodium: 138 mmol/L (ref 135–145)

## 2015-03-13 LAB — CBC
HEMATOCRIT: 33.6 % — AB (ref 36.0–46.0)
HEMOGLOBIN: 11.2 g/dL — AB (ref 12.0–15.0)
MCH: 30.7 pg (ref 26.0–34.0)
MCHC: 33.3 g/dL (ref 30.0–36.0)
MCV: 92.1 fL (ref 78.0–100.0)
PLATELETS: 147 10*3/uL — AB (ref 150–400)
RBC: 3.65 MIL/uL — AB (ref 3.87–5.11)
RDW: 13.3 % (ref 11.5–15.5)
WBC: 5.6 10*3/uL (ref 4.0–10.5)

## 2015-03-13 LAB — URINALYSIS, ROUTINE W REFLEX MICROSCOPIC
BILIRUBIN URINE: NEGATIVE
Glucose, UA: NEGATIVE mg/dL
HGB URINE DIPSTICK: NEGATIVE
KETONES UR: NEGATIVE mg/dL
Leukocytes, UA: NEGATIVE
Nitrite: NEGATIVE
PROTEIN: NEGATIVE mg/dL
Specific Gravity, Urine: 1.014 (ref 1.005–1.030)
pH: 6 (ref 5.0–8.0)

## 2015-03-13 LAB — APTT: APTT: 44 s — AB (ref 24–37)

## 2015-03-13 LAB — PROTIME-INR
INR: 1.33 (ref 0.00–1.49)
Prothrombin Time: 16.6 seconds — ABNORMAL HIGH (ref 11.6–15.2)

## 2015-03-13 MED ORDER — ACETAMINOPHEN 650 MG RE SUPP: 650.0000 mg | Freq: Four times a day (QID) | RECTAL | Status: DC | PRN

## 2015-03-13 MED ORDER — VANCOMYCIN HCL IN DEXTROSE 750-5 MG/150ML-% IV SOLN
750.0000 mg | Freq: Two times a day (BID) | INTRAVENOUS | Status: DC
  Administered 2015-03-13 – 2015-03-14 (×3): 750 mg via INTRAVENOUS
  Filled 2015-03-13 (×5): qty 150

## 2015-03-13 MED ORDER — HYDROMORPHONE HCL 1 MG/ML IJ SOLN
0.5000 mg | INTRAMUSCULAR | Status: DC | PRN
  Administered 2015-03-13 – 2015-03-17 (×6): 1 mg via INTRAVENOUS
  Filled 2015-03-13 (×6): qty 1

## 2015-03-13 MED ORDER — MORPHINE SULFATE (PF) 4 MG/ML IV SOLN
6.0000 mg | Freq: Once | INTRAVENOUS | Status: AC
  Administered 2015-03-13: 6 mg via INTRAVENOUS
  Filled 2015-03-13: qty 2

## 2015-03-13 MED ORDER — SODIUM CHLORIDE 0.9 % IV SOLN
INTRAVENOUS | Status: DC
  Administered 2015-03-13: 05:00:00 via INTRAVENOUS
  Administered 2015-03-15: 50 mL/h via INTRAVENOUS
  Administered 2015-03-16: 17:00:00 via INTRAVENOUS

## 2015-03-13 MED ORDER — VANCOMYCIN HCL 10 G IV SOLR
1500.0000 mg | Freq: Once | INTRAVENOUS | Status: AC
  Administered 2015-03-13: 1500 mg via INTRAVENOUS
  Filled 2015-03-13 (×2): qty 1500

## 2015-03-13 MED ORDER — SODIUM CHLORIDE 0.9% FLUSH
3.0000 mL | Freq: Two times a day (BID) | INTRAVENOUS | Status: DC
  Administered 2015-03-14 – 2015-03-16 (×3): 3 mL via INTRAVENOUS

## 2015-03-13 MED ORDER — ONDANSETRON HCL 4 MG PO TABS
4.0000 mg | ORAL_TABLET | Freq: Four times a day (QID) | ORAL | Status: DC | PRN
  Administered 2015-03-17: 4 mg via ORAL
  Filled 2015-03-13: qty 1

## 2015-03-13 MED ORDER — METOPROLOL SUCCINATE ER 100 MG PO TB24
100.0000 mg | ORAL_TABLET | Freq: Every day | ORAL | Status: DC
  Administered 2015-03-13 – 2015-03-17 (×5): 100 mg via ORAL
  Filled 2015-03-13 (×5): qty 1

## 2015-03-13 MED ORDER — LIDOCAINE-EPINEPHRINE 1 %-1:100000 IJ SOLN
10.0000 mL | Freq: Once | INTRAMUSCULAR | Status: AC
  Administered 2015-03-13: 10 mL via INTRADERMAL
  Filled 2015-03-13: qty 1

## 2015-03-13 MED ORDER — ONDANSETRON HCL 4 MG/2ML IJ SOLN
4.0000 mg | Freq: Four times a day (QID) | INTRAMUSCULAR | Status: DC | PRN
  Administered 2015-03-16 (×2): 4 mg via INTRAVENOUS
  Filled 2015-03-13 (×2): qty 2

## 2015-03-13 MED ORDER — CEFTRIAXONE SODIUM 2 G IJ SOLR
2.0000 g | Freq: Two times a day (BID) | INTRAMUSCULAR | Status: DC
  Administered 2015-03-13 – 2015-03-15 (×4): 2 g via INTRAVENOUS
  Filled 2015-03-13 (×6): qty 2

## 2015-03-13 MED ORDER — LEVOTHYROXINE SODIUM 25 MCG PO TABS
125.0000 ug | ORAL_TABLET | Freq: Every day | ORAL | Status: DC
  Administered 2015-03-14 – 2015-03-17 (×4): 125 ug via ORAL
  Filled 2015-03-13 (×4): qty 1

## 2015-03-13 MED ORDER — HEPARIN (PORCINE) IN NACL 100-0.45 UNIT/ML-% IJ SOLN
1100.0000 [IU]/h | INTRAMUSCULAR | Status: AC
  Administered 2015-03-13: 1100 [IU]/h via INTRAVENOUS
  Filled 2015-03-13: qty 250

## 2015-03-13 MED ORDER — AMPICILLIN SODIUM 2 G IJ SOLR
2.0000 g | INTRAMUSCULAR | Status: DC
  Administered 2015-03-13 – 2015-03-15 (×13): 2 g via INTRAVENOUS
  Filled 2015-03-13 (×16): qty 2000

## 2015-03-13 MED ORDER — DEXTROSE 5 % IV SOLN
2.0000 g | Freq: Once | INTRAVENOUS | Status: AC
  Administered 2015-03-13: 2 g via INTRAVENOUS
  Filled 2015-03-13: qty 2

## 2015-03-13 MED ORDER — DEXTROSE 5 % IV SOLN
600.0000 mg | Freq: Three times a day (TID) | INTRAVENOUS | Status: DC
  Administered 2015-03-13 – 2015-03-17 (×13): 600 mg via INTRAVENOUS
  Filled 2015-03-13 (×17): qty 12

## 2015-03-13 MED ORDER — ACETAMINOPHEN 325 MG PO TABS
650.0000 mg | ORAL_TABLET | Freq: Four times a day (QID) | ORAL | Status: DC | PRN
  Administered 2015-03-17: 650 mg via ORAL
  Filled 2015-03-13: qty 2

## 2015-03-13 MED ORDER — HYDRALAZINE HCL 20 MG/ML IJ SOLN
2.0000 mg | Freq: Four times a day (QID) | INTRAMUSCULAR | Status: DC | PRN
  Filled 2015-03-13: qty 1

## 2015-03-13 MED ORDER — OXYCODONE HCL 5 MG PO TABS
5.0000 mg | ORAL_TABLET | ORAL | Status: DC | PRN
  Administered 2015-03-13 – 2015-03-15 (×4): 5 mg via ORAL
  Filled 2015-03-13 (×4): qty 1

## 2015-03-13 MED ORDER — CETYLPYRIDINIUM CHLORIDE 0.05 % MT LIQD
7.0000 mL | Freq: Two times a day (BID) | OROMUCOSAL | Status: DC
  Administered 2015-03-13 – 2015-03-16 (×7): 7 mL via OROMUCOSAL

## 2015-03-13 MED ORDER — ACETAMINOPHEN 500 MG PO TABS
1000.0000 mg | ORAL_TABLET | Freq: Once | ORAL | Status: AC
  Administered 2015-03-13: 1000 mg via ORAL
  Filled 2015-03-13: qty 2

## 2015-03-13 MED ORDER — ALUM & MAG HYDROXIDE-SIMETH 200-200-20 MG/5ML PO SUSP: 30.0000 mL | Freq: Four times a day (QID) | ORAL | Status: DC | PRN

## 2015-03-13 NOTE — ED Notes (Signed)
Back from CT, alert, NAD, calm, resps e/u, no dyspnea.

## 2015-03-13 NOTE — Progress Notes (Signed)
NURSING PROGRESS NOTE  Wendy Mccormick [id number] Admission Data: 03/13/2015 4:35 AM Attending Provider: Briscoe Deutscher, MD GEX:[id number] N, MD Code Status: Full   Wendy Mccormick is a 82 y.o. female patient admitted from  [facility]  ED:  -No acute distress noted.  -No complaints of shortness of breath.  -No complaints of chest pain.   Cardiac Monitoring: N/A   Blood pressure 123/63, pulse 75, temperature 98.1 F (36.7 C), temperature source Oral, resp. rate 16, height  (1.676 m), weight 94.212 kg (207 lb 11.2 oz), SpO2 96 %.   IV Fluids:  IV in place, occlusive dsg intact without redness, IV cath hand right, condition patent and no redness none.   Allergies:  Hydrocodone  Past Medical History:   has a past medical history of Urinary incontinence; Dysrhythmia; Hypertension; Hypothyroidism; History of bronchitis; Shortness of breath dyspnea; GERD (gastroesophageal reflux disease); Arthritis; and Colon polyps.  Past Surgical History:   has past surgical history that includes Tumor removal (Right); Cataract extraction (Bilateral); Back surgery; Colonoscopy; Esophagogastroduodenoscopy; and Lumbar laminectomy/decompression microdiscectomy (Left, 07/22/2014).  Social History:   reports that she quit smoking about 37 years ago. She does not have any smokeless tobacco history on file. She reports that she does not drink alcohol or use illicit drugs.  Skin: Intact   Patient/Family orientated to room. Information packet given to patient/family. Admission inpatient armband information verified with patient/family to include name and date of birth and placed on patient arm. Side rails up x 2, fall assessment and education completed with patient/family. Patient/family able to verbalize understanding of risk associated with falls and verbalized understanding to call for assistance before getting out of bed. Call light within reach. Patient/family able to voice and demonstrate understanding  of unit orientation instructions.

## 2015-03-13 NOTE — ED Notes (Signed)
Pharmacy notified about vanc order

## 2015-03-13 NOTE — Care Management Note (Signed)
Case Management Note  Patient Details  Name: Wendy Mccormick MRN: [id number] Date of Birth: 05/08/1930  Subjective/Objective:                 Admitted with neck pain/fever, aseptic meningitis. From home with husband.HPI:  Atrial fibrillation on Eliquis RX, HTN, Hypothyroid.Pt states independent with ADL's. DME: walker and cane.PCP: Denny Peon.[facility].   Action/Plan: Return to home when medically stable. CM to f/u with disposition needs  Expected Discharge Date:                  Expected Discharge Plan:  Home/Self Care  In-House Referral:     Discharge planning Services  CM Consult  Post Acute Care Choice:    Choice offered to:     DME Arranged:    DME Agency:     HH Arranged:    HH Agency:     Status of Service:  In process, will continue to follow  Medicare Important Message Given:    Date Medicare IM Given:    Medicare IM give by:    Date Additional Medicare IM Given:    Additional Medicare Important Message give by:     If discussed at Long Length of Stay Meetings, dates discussed:    Additional Comments:  Wendy Mccormick (Spouse)  [phone]  Gae Gallop [place], [place] [phone] 03/13/2015, 2:00 PM

## 2015-03-13 NOTE — Progress Notes (Signed)
Report received from Tommy Rainwater, RN from [facility]. Pt is to be admitted to 5W22. Awaiting pt's arrival.

## 2015-03-13 NOTE — Consult Note (Signed)
Admission H&P    Chief Complaint: Severe headache and neck pain as well as fever.  HPI: Wendy Mccormick is an 82 y.o. female with atrial fibrillation on Eliquis, hypertension and hypothyroidism seen earlier at [facility] and subsequently transferred to [facility] for further management. She's been experiencing severe headache and neck pain since 03/10/2015. He's also been febrile with MAXIMUM TEMPERATURE of 101.6. Headache and neck pain have been exacerbated by coughing as well as with bowel movement. She's noticed pain in her neck when she walks (with each step). CT scan of her head showed no acute abnormality. Lumbar puncture was attempted to transfer, but was unsuccessful. Patient was started on empirical antibiotic therapy for acute meningitis.  Past Medical History  Diagnosis Date  . Urinary incontinence   . Dysrhythmia   . Hypertension     metoprolol  . Hypothyroidism   . History of bronchitis   . Shortness of breath dyspnea   . GERD (gastroesophageal reflux disease)   . Arthritis   . Colon polyps     Past Surgical History  Procedure Laterality Date  . 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 Cataract extraction Bilateral   . Back surgery    . Colonoscopy    . Esophagogastroduodenoscopy    . Lumbar laminectomy/decompression microdiscectomy Left 07/22/2014    Procedure: Microdiscectomy - left - L4-L5 extra foraminal;  Surgeon: Karie Chimera, MD;  Location: [place] [facility];  Service: Neurosurgery;  Laterality: Left;    History reviewed. No pertinent family history. Social History:  reports that she quit smoking about 37 years ago. She does not have any smokeless tobacco history on file. She reports that she does not drink alcohol or use illicit drugs.  Allergies:  Allergies  Allergen Reactions  . Hydrocodone Nausea And Vomiting    Medications Prior to Admission  Medication Sig Dispense Refill  . apixaban (ELIQUIS) 5 MG TABS tablet Take 5 mg by mouth 2 (two)  times daily.    Marland Kitchen gabapentin (NEURONTIN) 300 MG capsule Take 300 mg by mouth daily.    Marland Kitchen levothyroxine (SYNTHROID, LEVOTHROID) 125 MCG tablet Take 125 mcg by mouth daily.    . metoprolol succinate (TOPROL-XL) 100 MG 24 hr tablet Take 100 mg by mouth daily.    . pantoprazole (PROTONIX) 40 MG tablet Take 40 mg by mouth daily.      ROS: History obtained from the patient  General ROS: negative for - chills, fatigue, fever, night sweats, weight gain or weight loss Psychological ROS: negative for - behavioral disorder, hallucinations, memory difficulties, mood swings or suicidal ideation Ophthalmic ROS: negative for - blurry vision, double vision, eye pain or loss of vision ENT ROS: negative for - epistaxis, nasal discharge, oral lesions, sore throat, tinnitus or vertigo Allergy and Immunology ROS: negative for - hives or itchy/watery eyes Hematological and Lymphatic ROS: negative for - bleeding problems, bruising or swollen lymph nodes Endocrine ROS: negative for - galactorrhea, hair pattern changes, polydipsia/polyuria or temperature intolerance Respiratory ROS: negative for - cough, hemoptysis, shortness of breath or wheezing Cardiovascular ROS: negative for - chest pain, dyspnea on exertion, edema or irregular heartbeat Gastrointestinal ROS: negative for - abdominal pain, diarrhea, hematemesis, nausea/vomiting or stool incontinence Genito-Urinary ROS: negative for - dysuria, hematuria, incontinence or urinary frequency/urgency Musculoskeletal ROS: Pain as noted in present illness Neurological ROS: as noted in HPI Dermatological ROS: negative for rash and skin lesion changes  Physical Examination: Blood pressure 123/63, pulse 75, temperature  98.1 F (36.7 C), temperature source Oral, resp. rate 16, height _0  (1.676 m), weight 94.212 kg (207 lb 11.2 oz), SpO2 96 %.  HEENT-  Normocephalic, no lesions, without obvious abnormality.  Normal external eye and conjunctiva.  Normal TM's  bilaterally.  Normal auditory canals and external ears. Normal external nose, mucus membranes and septum.  Normal pharynx. Neck supple with no masses, nodes, nodules or enlargement. Cardiovascular - regular rate and rhythm, S1, S2 normal, no murmur, click, rub or gallop Lungs - chest clear, no wheezing, rales, normal symmetric air entry Abdomen - soft, non-tender; bowel sounds normal; no masses,  no organomegaly Extremities - no joint deformities, effusion, or inflammation and no edema  Neurologic Examination: Mental Status: Alert, oriented, thought content appropriate.  Speech fluent without evidence of aphasia. Able to follow commands without difficulty. Cranial Nerves: II-Visual fields were normal. III/IV/VI-Pupils were equal and reacted normally to light. Extraocular movements were full and conjugate.    V/VII-no facial numbness and no facial weakness. VIII-normal. X-normal speech and symmetrical palatal movement. XI: trapezius strength/neck flexion strength normal bilaterally XII-midline tongue extension with normal strength. Motor: 5/5 bilaterally with normal tone and bulk Sensory: Normal throughout. Deep Tendon Reflexes: 1+ and symmetric. Plantars: Extensor bilaterally Carotid auscultation: Normal Meningeal Signs: Positive with markedly reduced range of motion of neck  flexion and active resistance resistance.  Results for orders placed or performed during the hospital encounter of 03/12/15 (from the past 48 hour(s))  Urinalysis, Routine w reflex microscopic (not at [facility])     Status: None   Collection Time: 03/13/15 12:25 AM  Result Value Ref Range   Color, Urine YELLOW YELLOW   APPearance CLEAR CLEAR   Specific Gravity, Urine 1.014 1.005 - 1.030   pH 6.0 5.0 - 8.0   Glucose, UA NEGATIVE NEGATIVE mg/dL   Hgb urine dipstick NEGATIVE NEGATIVE   Bilirubin Urine NEGATIVE NEGATIVE   Ketones, ur NEGATIVE NEGATIVE mg/dL   Protein, ur NEGATIVE NEGATIVE mg/dL   Nitrite NEGATIVE  NEGATIVE   Leukocytes, UA NEGATIVE NEGATIVE    Comment: MICROSCOPIC NOT DONE ON URINES WITH NEGATIVE PROTEIN, BLOOD, LEUKOCYTES, NITRITE, OR GLUCOSE <1000 mg/dL.  CBC with Differential/Platelet     Status: Abnormal   Collection Time: 03/13/15 12:55 AM  Result Value Ref Range   WBC 5.6 4.0 - 10.5 K/uL   RBC 3.92 3.87 - 5.11 MIL/uL   Hemoglobin 11.8 (L) 12.0 - 15.0 g/dL   HCT 36.8 36.0 - 46.0 %   MCV 93.9 78.0 - 100.0 fL   MCH 30.1 26.0 - 34.0 pg   MCHC 32.1 30.0 - 36.0 g/dL   RDW 13.0 11.5 - 15.5 %   Platelets 151 150 - 400 K/uL   Neutrophils Relative % 76 %   Neutro Abs 4.2 1.7 - 7.7 K/uL   Lymphocytes Relative 14 %   Lymphs Abs 0.8 0.7 - 4.0 K/uL   Monocytes Relative 9 %   Monocytes Absolute 0.5 0.1 - 1.0 K/uL   Eosinophils Relative 1 %   Eosinophils Absolute 0.1 0.0 - 0.7 K/uL   Basophils Relative 0 %   Basophils Absolute 0.0 0.0 - 0.1 K/uL  Protime-INR     Status: Abnormal   Collection Time: 03/13/15 12:55 AM  Result Value Ref Range   Prothrombin Time 16.6 (H) 11.6 - 15.2 seconds   INR 1.33 0.00 - 1.61  Basic metabolic panel     Status: Abnormal   Collection Time: 03/13/15 12:55 AM  Result Value Ref Range  Sodium 138 135 - 145 mmol/L   Potassium 3.9 3.5 - 5.1 mmol/L   Chloride 105 101 - 111 mmol/L   CO2 25 22 - 32 mmol/L   Glucose, Bld 110 (H) 65 - 99 mg/dL   BUN 12 6 - 20 mg/dL   Creatinine, Ser 0.85 0.44 - 1.00 mg/dL   Calcium 8.4 (L) 8.9 - 10.3 mg/dL   GFR calc non Af Amer >60 >60 mL/min   GFR calc Af Amer >60 >60 mL/min    Comment: (NOTE) The eGFR has been calculated using the CKD EPI equation. This calculation has not been validated in all clinical situations. eGFR's persistently <60 mL/min signify possible Chronic Kidney Disease.    Anion gap 8 5 - 15  APTT     Status: Abnormal   Collection Time: 03/13/15 12:55 AM  Result Value Ref Range   aPTT 44 (H) 24 - 37 seconds    Comment:        IF BASELINE aPTT IS ELEVATED, SUGGEST PATIENT RISK ASSESSMENT BE  USED TO DETERMINE APPROPRIATE ANTICOAGULANT THERAPY.    Ct Head Wo Contrast  03/13/2015  CLINICAL DATA:  Headache and fever.  Concern for meningitis. EXAM: CT HEAD WITHOUT CONTRAST TECHNIQUE: Contiguous axial images were obtained from the base of the skull through the vertex without intravenous contrast. COMPARISON:  None. FINDINGS: Skull and Sinuses:Negative for fracture or destructive process. No acute sinusitis or mastoiditis where visualized. TMJ osteoarthritis. Visualized orbits: Bilateral cataract resection.  No acute finding. Brain: No evidence of acute infarction, hemorrhage, hydrocephalus, or mass lesion/mass effect. No evidence of sulcal or ventricular debris. Age congruent small-vessel ischemic change in the deep cerebral white matter. IMPRESSION: Unremarkable head CT for age. Electronically Signed   By: Monte Fantasia M.D.   On: 03/13/2015 00:06    Assessment/Plan  82 year old lady presenting with severe headache and neck pain as well as fever for 3 days, with clinical findings indicative of acute meningitis, most likely of viral etiology.  Recommendations: 1. Lumbar puncture under fluoroscopy 2. Hold Eliquis until lumbar puncture has been obtained (last dose was 7 AM on 03/12/2015) 3. Add acyclovir IV for probable viral meningitis treatment coverage 4. Continue broad-spectrum antibiotic coverage for now, as well.  We will continue to follow this patient with you.  C.R. Nicole Kindred, MD [facility]  [phone]   03/13/2015, 4:46 AM

## 2015-03-13 NOTE — ED Notes (Signed)
Dr. Mora Bellman at [facility] for LP, VSS, pt alert, NAD, calm, cooperative, resps e/u, no dyspnea noted.

## 2015-03-13 NOTE — Progress Notes (Signed)
Utilization Review Completed.Wendy Mccormick T2/06/2015  

## 2015-03-13 NOTE — ED Notes (Signed)
carelink here, pt back from b/r by w/c, VSS, no changes, denies needs or questions.

## 2015-03-13 NOTE — H&P (Signed)
[facility] Admission History and Physical       Nilsa Macht WUJ:[id number] DOB: Jul 16, 1930 DOA: 03/12/2015  Referring physician: EDP PCP: Cheral Bay, MD  Specialists:   Chief Complaint: Headache and Fever  HPI: Wendy Mccormick is a 82 y.o. female with a history of Atrial fibrillation on Eliquis RX, HTN, Hypothyroid who presented to the [facility] ED with complaints of 10/10 Headache and Neck pain x 3 days and fever to 102 during the last 24 hours.  She reports increased pain when placing her neck to her chest, and noticed increased pain in her neck when she would walk and take steps.   She was evaluated in the ED an a CT scan of the Head was performed and was negative for acute findings.  A Lumbar Puncture was attempted without success by the EDP, and Neurology Dr Roseanne Reno was consulted.   She was placed empirically on IV Vancomycin and Rocephin, and then IV Acyclovir was added.   She was transferred for admission and further workup at the [facility]  Review of Systems:  Constitutional: No Weight Loss, No Weight Gain, Night Sweats, +Fevers, Chills, Dizziness, Light Headedness, Fatigue, or Generalized Weakness HEENT:  +Headache, Difficulty Swallowing,Tooth/Dental Problems,Sore Throat,  No Sneezing, Rhinitis, Ear Ache, Nasal Congestion, or Post Nasal Drip, +Meningismus, Cardio-vascular:  No Chest pain, Orthopnea, PND, Edema in Lower Extremities, Anasarca, Dizziness, Palpitations  Resp: No Dyspnea, No DOE, No Productive Cough, No Non-Productive Cough, No Hemoptysis, No Wheezing.    GI: No Heartburn, Indigestion, Abdominal Pain, Nausea, Vomiting, Diarrhea, Constipation, Hematemesis, Hematochezia, Melena, Change in Bowel Habits,  Loss of Appetite  GU: No Dysuria, No Change in Color of Urine, No Urgency or Urinary Frequency, No Flank pain.  Musculoskeletal: No Joint Pain or Swelling, No Decreased Range of Motion, No Back Pain.  Neurologic: No Syncope, No Seizures, Muscle Weakness,  Paresthesia, Vision Disturbance or Loss, No Diplopia, No Vertigo, No Difficulty Walking,  Skin: No Rash or Lesions. Psych: No Change in Mood or Affect, No Depression or Anxiety, No Memory loss, No Confusion, or Hallucinations   Past Medical History  Diagnosis Date  . Urinary incontinence   . Dysrhythmia   . Hypertension     metoprolol  . Hypothyroidism   . History of bronchitis   . Shortness of breath dyspnea   . GERD (gastroesophageal reflux disease)   . Arthritis   . Colon polyps      Past Surgical History  Procedure Laterality Date  . 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 Cataract extraction Bilateral   . Back surgery    . Colonoscopy    . Esophagogastroduodenoscopy    . Lumbar laminectomy/decompression microdiscectomy Left 07/22/2014    Procedure: Microdiscectomy - left - L4-L5 extra foraminal;  Surgeon: Aliene Beams, MD;  Location: [facility];  Service: Neurosurgery;  Laterality: Left;      Prior to Admission medications   Medication Sig Start Date End Date Taking? Authorizing Provider  apixaban (ELIQUIS) 5 MG TABS tablet Take 5 mg by mouth 2 (two) times daily.    Historical Provider, MD  gabapentin (NEURONTIN) 300 MG capsule Take 300 mg by mouth daily.    Historical Provider, MD  levothyroxine (SYNTHROID, LEVOTHROID) 125 MCG tablet Take 125 mcg by mouth daily.    Historical Provider, MD  metoprolol succinate (TOPROL-XL) 100 MG 24 hr tablet Take 100 mg by mouth daily.    Historical Provider, MD  pantoprazole (PROTONIX) 40 MG tablet  Take 40 mg by mouth daily.    Historical Provider, MD     Allergies  Allergen Reactions  . Hydrocodone Nausea And Vomiting    Social History:  reports that she quit smoking about 37 years ago. She does not have any smokeless tobacco history on file. She reports that she does not drink alcohol or use illicit drugs.     History reviewed. No pertinent family history.     Physical Exam:  GEN:  Pleasant Obese  Elderly  82 y.o. Caucasian female examined and in no acute distress; cooperative with exam Filed Vitals:   03/13/15 0231 03/13/15 0245 03/13/15 0406 03/13/15 0410  BP:  158/82  123/63  Pulse:  92  75  Temp: 99.7 F (37.6 C)   98.1 F (36.7 C)  TempSrc: Oral   Oral  Resp:    16  Height:    (1.676 m)   Weight:   94.212 kg (207 lb 11.2 oz)   SpO2:  98%  96%   Blood pressure 123/63, pulse 75, temperature 98.1 F (36.7 C), temperature source Oral, resp. rate 16, height  (1.676 m), weight 94.212 kg (207 lb 11.2 oz), SpO2 96 %. PSYCH: She is alert and oriented x4; does not appear anxious does not appear depressed; affect is normal HEENT: Normocephalic and Atraumatic, Mucous membranes pink; PERRLA; EOM intact; Fundi:  Benign;  No scleral icterus, Nares: Patent, Oropharynx: Clear, Edentulous with Dentures Present,    Neck:  FROM, No Cervical Lymphadenopathy nor Thyromegaly or Carotid Bruit; No JVD; Breasts:: Not examined CHEST WALL: No tenderness CHEST: Normal respiration, clear to auscultation bilaterally HEART: Regular rate and rhythm; no murmurs rubs or gallops BACK: No kyphosis or scoliosis; No CVA tenderness ABDOMEN: Positive Bowel Sounds, Obese, Soft Non-Tender, No Rebound or Guarding; No Masses, No Organomegaly, No Pannus; No Intertriginous candida. Rectal Exam: Not done EXTREMITIES: No Cyanosis, Clubbing, or Edema; No Ulcerations. Genitalia: not examined PULSES: 2+ and symmetric SKIN: Normal hydration no rash or ulceration CNS:  Alert and Oriented x 4, No Focal Deficits Mental Status:  Alert, Oriented, Thought Content Appropriate. Speech Fluent without evidence of Aphasia. Able to follow 3 step commands without difficulty.  In No obvious pain.   Cranial Nerves:  II: Discs flat bilaterally; Visual fields Intact, Pupils equal and reactive.    III,IV, VI: Extra-ocular motions intact bilaterally    V,VII: smile symmetric, facial light touch sensation normal bilaterally     VIII: hearing decreasesd bilaterally    IX,X: gag reflex present    XI: bilateral shoulder shrug    XII: midline tongue extension   Motor:  Right:  Upper extremity 5/5     Left:  Upper extremity 5/5     Right:  Lower extremity 5/5    Left:  Lower extremity 5/5     Tone and Bulk:  normal tone throughout; no atrophy noted   Sensory:  Pinprick and light touch intact throughout, bilaterally   Deep Tendon Reflexes: 2+ and symmetric throughout   Plantars/ Babinski:  Right: equivocal    Left: equivocal   Cerebellar:  Finger to nose with or without difficulty.   Gait: deferred    Vascular: pulses palpable throughout    Labs on Admission:  Basic Metabolic Panel:  Recent Labs Lab 03/13/15 0055  NA 138  K 3.9  CL 105  CO2 25  GLUCOSE 110*  BUN 12  CREATININE 0.85  CALCIUM 8.4*   Liver Function Tests: No results for input(s): AST,  ALT, ALKPHOS, BILITOT, PROT, ALBUMIN in the last 168 hours. No results for input(s): LIPASE, AMYLASE in the last 168 hours. No results for input(s): AMMONIA in the last 168 hours. CBC:  Recent Labs Lab 03/13/15 0055  WBC 5.6  NEUTROABS 4.2  HGB 11.8*  HCT 36.8  MCV 93.9  PLT 151   Cardiac Enzymes: No results for input(s): CKTOTAL, CKMB, CKMBINDEX, TROPONINI in the last 168 hours.  BNP (last 3 results) No results for input(s): BNP in the last 8760 hours.  ProBNP (last 3 results) No results for input(s): PROBNP in the last 8760 hours.  CBG: No results for input(s): GLUCAP in the last 168 hours.  Radiological Exams on Admission: Ct Head Wo Contrast  03/13/2015  CLINICAL DATA:  Headache and fever.  Concern for meningitis. EXAM: CT HEAD WITHOUT CONTRAST TECHNIQUE: Contiguous axial images were obtained from the base of the skull through the vertex without intravenous contrast. COMPARISON:  None. FINDINGS: Skull and Sinuses:Negative for fracture or destructive process. No acute sinusitis or mastoiditis where visualized. TMJ  osteoarthritis. Visualized orbits: Bilateral cataract resection.  No acute finding. Brain: No evidence of acute infarction, hemorrhage, hydrocephalus, or mass lesion/mass effect. No evidence of sulcal or ventricular debris. Age congruent small-vessel ischemic change in the deep cerebral white matter. IMPRESSION: Unremarkable head CT for age. Electronically Signed   By: Marnee Spring M.D.   On: 03/13/2015 00:06     EKG: Independently reviewed.        Assessment/Plan:   82 y.o. female with  Principal Problem:   1.    Meningitis   Droplet Precautions   Empiric IV Vancomycin and IV Rocephin and IV Acyclovir   Gentle IVFs   LP per Radiology after 7 AM due to her Eliquis Rx   Active Problems:   2.    Febrile illness- due to #1      3.    Headache- due to #1     4.    Anticoagulant long-term use- for A.fib   Holding Eliquis Rx     5.    Atrial fibrillation (HCC)   On Eliquis and Metoprolol Rx     6.    Hypertension   On Metoprolol Rx   IV Hydralazine PRN SBP > 180     7.    Hypothyroidism   Resume Levothyroxine Rx when taking PO     8.    DVT Prophylaxis   SCDs   Resume Eliquis Rx in 24 - 48 hours after LP    Code Status:     FULL CODE       Family Communication:   No Family Present    Disposition Plan:  Observation Status        Time spent:  69 Minutes      Ron Parker [facility] Pager [phone]   If 7AM -7PM Please Contact the Day Rounding Team MD for [facility]  If 7PM-7AM, Please Contact Night-Floor Coverage  [email or web address] Password [email or web address] 03/13/2015, 4:43 AM     ADDENDUM:   Patient was seen and examined on 03/13/2015

## 2015-03-13 NOTE — Progress Notes (Signed)
ANTICOAGULATION CONSULT NOTE - Initial Consult  Pharmacy Consult for heparin Indication: atrial fibrillation  Patient Measurements: Height:  (167.6 cm) Weight: 207 lb 11.2 oz (94.212 kg) IBW/kg (Calculated) : 59.3 Heparin Dosing Weight: 80 kg  Vital Signs: Temp: 98.1 F (36.7 C) (02/05 0410) Temp Source: Oral (02/05 0410) BP: 128/58 mmHg (02/05 0845) Pulse Rate: 72 (02/05 0845)  Labs:  Recent Labs  03/13/15 0055 03/13/15 0601  HGB 11.8* 11.2*  HCT 36.8 33.6*  PLT 151 147*  APTT 44*  --   LABPROT 16.6*  --   INR 1.33  --   CREATININE 0.85 0.85   Assessment: 19 yoF with PMHx of afib on apixaban 5 mg BID PTA admitted 03/12/15 with concern for aseptic meningitis. Apixaban has been held since admission, last dose on 2/4 am, and pharmacy consulted to start and continue heparin until 3 am on 2/6 at which point it will be discontinued in anticipation of LP preformed by IR.  Goal of Therapy:  Heparin level 0.3-0.7 units/ml aPTT 66-102 seconds Monitor platelets by anticoagulation protocol: Yes   Plan:  1. Start heparin at 1100 units/hr with planned stop time of 2/6 at 0300 2. Obtain HL and apTT prior to stopping heparin drip to help guide future dosing in the event that apixaban restart is delayed 3. F/u on resumption of anticoagulation post procedure   Pollyann Samples, PharmD, BCPS 03/13/2015, 2:47 PM Pager: [phone]

## 2015-03-13 NOTE — Progress Notes (Signed)
[facility] PROGRESS NOTE    Progress Note   Wendy Mccormick RUE:[id number] DOB: [birth year] DOA: 03/12/2015 PCP: Cheral Bay, MD   Brief Narrative:   Wendy Mccormick is an 82 y.o. female past medical history of A. fib on El[facility] hypointense to code she was experiencing severe headache with neck pain. She also had a temperature 1.6.  Assessment/Plan:   Aseptic meningitis: Started empirically on IV vancomycin, Rocephin, acyclovir and add ampicillin. LP scheduled by radiology at 7 AM. Patient has remained afebrile, she's had no leukocytosis and her vitals are stable. Appreciate neurology and interventional radiologist assistance.  Essential Hypertension Continue metoprolol.  Chronic Atrial fibrillation (HCC) Cont to hold Eluquis, rate control continue metoprolol.  Hypothyroidism Continue Synthroid.    Febrile illness   Headache    DVT Prophylaxis - Lovenox ordered.  Family Communication: none Disposition Plan: Home 2 days Code Status:     Code Status Orders        Start     Ordered   03/13/15 0454  Full code   Continuous     03/13/15 0453    Code Status History    Date Active Date Inactive Code Status Order ID Comments User Context   07/22/2014 11:56 AM 07/23/2014  8:09 PM Full Code [id number]  Aliene Beams, MD Inpatient    Advance Directive Documentation        Most Recent Value   Type of Advance Directive  Healthcare Power of Attorney, Living will   Pre-existing out of facility DNR order (yellow form or pink MOST form)     "MOST" Form in Place?          IV Access:    Peripheral IV   Procedures and diagnostic studies:   Ct Head Wo Contrast  03/13/2015  CLINICAL DATA:  Headache and fever.  Concern for meningitis. EXAM: CT HEAD WITHOUT CONTRAST TECHNIQUE: Contiguous axial images were obtained from the base of the skull through the vertex without intravenous contrast. COMPARISON:  None. FINDINGS: Skull and Sinuses:Negative for  fracture or destructive process. No acute sinusitis or mastoiditis where visualized. TMJ osteoarthritis. Visualized orbits: Bilateral cataract resection.  No acute finding. Brain: No evidence of acute infarction, hemorrhage, hydrocephalus, or mass lesion/mass effect. No evidence of sulcal or ventricular debris. Age congruent small-vessel ischemic change in the deep cerebral white matter. IMPRESSION: Unremarkable head CT for age. Electronically Signed   By: Marnee Spring M.D.   On: 03/13/2015 00:06     Medical Consultants:    None.  Anti-Infectives:   Anti-infectives    Start     Dose/Rate Route Frequency Ordered Stop   03/13/15 2200  vancomycin (VANCOCIN) IVPB 750 mg/150 ml premix     750 mg 150 mL/hr over 60 Minutes Intravenous Every 12 hours 03/13/15 0509     03/13/15 1400  cefTRIAXone (ROCEPHIN) 2 g in dextrose 5 % 50 mL IVPB     2 g 100 mL/hr over 30 Minutes Intravenous Every 12 hours 03/13/15 0451     03/13/15 0730  ampicillin (OMNIPEN) 2 g in sodium chloride 0.9 % 50 mL IVPB     2 g 150 mL/hr over 20 Minutes Intravenous 6 times per day 03/13/15 0724     03/13/15 0515  acyclovir (ZOVIRAX) 600 mg in dextrose 5 % 100 mL IVPB     600 mg 112 mL/hr over 60 Minutes Intravenous 3 times per day 03/13/15 0509     03/13/15 0215  vancomycin (VANCOCIN) 1,500 mg  in sodium chloride 0.9 % 500 mL IVPB     1,500 mg 250 mL/hr over 120 Minutes Intravenous  Once 03/13/15 0203 03/13/15 0711   03/13/15 0215  cefTRIAXone (ROCEPHIN) 2 g in dextrose 5 % 50 mL IVPB     2 g 100 mL/hr over 30 Minutes Intravenous  Once 03/13/15 0203 03/13/15 4098      Subjective:    Wendy Mccormick she relates her headache is improved, continues to have neck pain.  Objective:    Filed Vitals:   03/13/15 0231 03/13/15 0245 03/13/15 0406 03/13/15 0410  BP:  158/82  123/63  Pulse:  92  75  Temp: 99.7 F (37.6 C)   98.1 F (36.7 C)  TempSrc: Oral   Oral  Resp:    16  Height:   5\' 6"  (1.676 m)   Weight:   94.212  kg (207 lb 11.2 oz)   SpO2:  98%  96%    Intake/Output Summary (Last 24 hours) at 03/13/15 0759 Last data filed at 03/13/15 1191  Gross per 24 hour  Intake 1704.5 ml  Output    250 ml  Net 1454.5 ml   Filed Weights   03/13/15 0406  Weight: 94.212 kg (207 lb 11.2 oz)    Exam: Gen:  NAD Cardiovascular:  RRR, No M/R/G Chest and lungs:   CTAB Abdomen:  Abdomen soft, NT/ND, + BS Extremities:  No edema. Neuro: Awake alert and oriented 3, she is complaining of photophobia, she relates her headaches are improved but continues to have neck pain. She has no rigidity and nonfocal.    Data Reviewed:    Labs: Basic Metabolic Panel:  Recent Labs Lab 03/13/15 0055 03/13/15 0601  NA 138 138  K 3.9 3.9  CL 105 102  CO2 25 25  GLUCOSE 110* 109*  BUN 12 9  CREATININE 0.85 0.85  CALCIUM 8.4* 8.4*   GFR Estimated Creatinine Clearance: 59 mL/min (by C-G formula based on Cr of 0.85). Liver Function Tests: No results for input(s): AST, ALT, ALKPHOS, BILITOT, PROT, ALBUMIN in the last 168 hours. No results for input(s): LIPASE, AMYLASE in the last 168 hours. No results for input(s): AMMONIA in the last 168 hours. Coagulation profile  Recent Labs Lab 03/13/15 0055  INR 1.33    CBC:  Recent Labs Lab 03/13/15 0055 03/13/15 0601  WBC 5.6 5.6  NEUTROABS 4.2  --   HGB 11.8* 11.2*  HCT 36.8 33.6*  MCV 93.9 92.1  PLT 151 147*   Cardiac Enzymes: No results for input(s): CKTOTAL, CKMB, CKMBINDEX, TROPONINI in the last 168 hours. BNP (last 3 results) No results for input(s): PROBNP in the last 8760 hours. CBG: No results for input(s): GLUCAP in the last 168 hours. D-Dimer: No results for input(s): DDIMER in the last 72 hours. Hgb A1c: No results for input(s): HGBA1C in the last 72 hours. Lipid Profile: No results for input(s): CHOL, HDL, LDLCALC, TRIG, CHOLHDL, LDLDIRECT in the last 72 hours. Thyroid function studies: No results for input(s): TSH, T4TOTAL, T3FREE,  THYROIDAB in the last 72 hours.  Invalid input(s): FREET3 Anemia work up: No results for input(s): VITAMINB12, FOLATE, FERRITIN, TIBC, IRON, RETICCTPCT in the last 72 hours. Sepsis Labs:  Recent Labs Lab 03/13/15 0055 03/13/15 0601  WBC 5.6 5.6   Microbiology No results found for this or any previous visit (from the past 240 hour(s)).   Medications:   . acyclovir  600 mg Intravenous 3 times per day  . ampicillin (OMNIPEN)  IV  2 g Intravenous 6 times per day  . antiseptic oral rinse  7 mL Mouth Rinse BID  . cefTRIAXone (ROCEPHIN)  IV  2 g Intravenous Q12H  . metoprolol succinate  100 mg Oral Daily  . sodium chloride flush  3 mL Intravenous Q12H  . vancomycin  750 mg Intravenous Q12H   Continuous Infusions: . sodium chloride 50 mL/hr at 03/13/15 0511    Time spent: 25 min   LOS: 0 days   Marinda Elk  [facility] Pager [phone]  *Please refer to [email or web address], password [email or web address] to get updated schedule on who will round on this patient, as hospitalists switch teams weekly. If 7PM-7AM, please contact night-coverage at [email or web address], password [email or web address] for any overnight needs.  03/13/2015, 7:59 AM

## 2015-03-13 NOTE — ED Notes (Signed)
Dr. Mora Bellman in to update pt/family about admission plan.

## 2015-03-13 NOTE — ED Notes (Signed)
Unable to obtain CSF, developing plan for transfer / admission

## 2015-03-13 NOTE — ED Notes (Signed)
Pt up to b/r via w/c with EMT, no changes, tolerated well, returns w/o change, states, "pain increased d/t movement"

## 2015-03-13 NOTE — Progress Notes (Signed)
Accepted to med-surg bed at [facility].  Developed HA, neck pain and fever to 102 at home today; never had similar sxs before. Febrile at [facility] ED with neck pain and no other source for infxn identified.  She had lumbar surgery last Summer, which may have complicated LP attempts; EDP tried for 45 mins before aborting.  Dr. Roseanne Reno of neuro was consulted by EDP and agreed with transfer to [facility].  Empiric meningitic-dose vanc and Rocephin given.  Has remained hemodynamically stable, A&O x4.

## 2015-03-13 NOTE — Progress Notes (Signed)
Pharmacy Antibiotic Note  Wendy Mccormick is a 82 y.o. female admitted on 03/12/2015 with meningitis.  Pharmacy has been consulted for Vancomycin and Acyclovir dosing. Presented to [facility] with 3 days of fever/neck pain. WBC WNL. Renal function good.   Plan: -Vancomycin 1500 mg IV load ordered at [facility], then give 750 mg IV q12 -Ceftriaxone per MD -Acyclovir 10 mg/kg IV q8h -Drug levels as indicated -F/U infectious work-up  Height:  (167.6 cm) Weight: 207 lb 11.2 oz (94.212 kg) IBW/kg (Calculated) : 59.3  Temp (24hrs), Avg:99.5 F (37.5 C), Min:98.1 F (36.7 C), Max:101.3 F (38.5 C)   Recent Labs Lab 03/13/15 0055  WBC 5.6  CREATININE 0.85    Estimated Creatinine Clearance: 59 mL/min (by C-G formula based on Cr of 0.85).    Allergies  Allergen Reactions  . Hydrocodone Nausea And Vomiting   Thank you for allowing pharmacy to be a part of this patient's care.  Abran Duke 03/13/2015 5:03 AM

## 2015-03-14 ENCOUNTER — Inpatient Hospital Stay (HOSPITAL_COMMUNITY): Payer: Medicare Other

## 2015-03-14 DIAGNOSIS — I4891 Unspecified atrial fibrillation: Secondary | ICD-10-CM

## 2015-03-14 LAB — CSF CELL COUNT WITH DIFFERENTIAL
RBC COUNT CSF: 2 /mm3 — AB
RBC COUNT CSF: 10 /mm3 — AB
TUBE #: 1
Tube #: 4
WBC CSF: 2 /mm3 (ref 0–5)
WBC CSF: 3 /mm3 (ref 0–5)

## 2015-03-14 LAB — GLUCOSE, CSF: Glucose, CSF: 65 mg/dL (ref 40–70)

## 2015-03-14 LAB — APTT: APTT: 82 s — AB (ref 24–37)

## 2015-03-14 LAB — PROTEIN, CSF: TOTAL PROTEIN, CSF: 39 mg/dL (ref 15–45)

## 2015-03-14 LAB — HEPARIN LEVEL (UNFRACTIONATED): HEPARIN UNFRACTIONATED: 0.64 [IU]/mL (ref 0.30–0.70)

## 2015-03-14 MED ORDER — DIPHENHYDRAMINE HCL 50 MG/ML IJ SOLN
12.5000 mg | Freq: Once | INTRAMUSCULAR | Status: AC
  Administered 2015-03-14: 12.5 mg via INTRAVENOUS
  Filled 2015-03-14: qty 1

## 2015-03-14 MED ORDER — DIPHENHYDRAMINE HCL 12.5 MG/5ML PO ELIX
12.5000 mg | ORAL_SOLUTION | Freq: Every evening | ORAL | Status: DC | PRN
  Administered 2015-03-15: 12.5 mg via ORAL
  Filled 2015-03-14 (×4): qty 5

## 2015-03-14 MED ORDER — LIDOCAINE HCL (PF) 1 % IJ SOLN
INTRAMUSCULAR | Status: AC
  Administered 2015-03-14: 5 mL via INTRAMUSCULAR
  Filled 2015-03-14: qty 5

## 2015-03-14 NOTE — Discharge Instructions (Addendum)

## 2015-03-14 NOTE — Progress Notes (Signed)
ANTICOAGULATION CONSULT NOTE  Pharmacy Consult for heparin Indication: atrial fibrillation  Patient Measurements: Height:  (167.6 cm) Weight: 207 lb 11.2 oz (94.212 kg) IBW/kg (Calculated) : 59.3 Heparin Dosing Weight: 80 kg  Vital Signs: Temp: 98.4 F (36.9 C) (02/06 0554) Temp Source: Oral (02/06 0554) BP: 159/76 mmHg (02/06 0554) Pulse Rate: 88 (02/06 0554)  Labs:  Recent Labs  03/13/15 0055 03/13/15 0601 03/14/15 0238  HGB 11.8* 11.2*  --   HCT 36.8 33.6*  --   PLT 151 147*  --   APTT 44*  --  82*  LABPROT 16.6*  --   --   INR 1.33  --   --   HEPARINUNFRC  --   --  0.64  CREATININE 0.85 0.85  --    Assessment: 73 yoF with PMHx of afib on apixaban 5 mg BID PTA admitted 03/12/15 with concern for aseptic meningitis. Apixaban has been held since admission, last dose on 2/4 AM, and pharmacy consulted to start and continue heparin until 3 am on 2/6 at which point it will be discontinued in anticipation of LP preformed by IR - currently ip. Hg 11.2, plt 147. No bleed documented.  Goal of Therapy:  Heparin level 0.3-0.7 units/ml aPTT 66-102 seconds Monitor platelets by anticoagulation protocol: Yes   Plan:  F/u on resumption of anticoagulation post procedure   Babs Bertin, PharmD, [facility] Clinical Pharmacist Pager [phone] 03/14/2015 8:51 AM

## 2015-03-14 NOTE — Progress Notes (Signed)
[facility] PROGRESS NOTE    Progress Note   Wendy Mccormick ZOX:[id number] DOB: [birth year] DOA: 03/12/2015 PCP: Cheral Bay, MD   Brief Narrative:   Wendy Mccormick is an 82 y.o. female past medical history of A. fib on Eluquis [facility] hypointense to code she was experiencing severe headache with neck pain. She also had a temperature 1.6.  Assessment/Plan:   Aseptic meningitis: Started empirically on IV vancomycin, Rocephin, acyclovir and add ampicillin. LP scheduled by radiology at 7 AM. Patient has remained afebrile, she's had no leukocytosis and her vitals are stable. IR perform LP on 03/14/2015.  Essential Hypertension Continue metoprolol.  Chronic Atrial fibrillation (HCC) Discontinue heparin. Can restart Eluquis in the morning.  Hypothyroidism Continue Synthroid.    Febrile illness   Headache    DVT Prophylaxis - Lovenox ordered.  Family Communication: none Disposition Plan: Home 2 days Code Status:     Code Status Orders        Start     Ordered   03/13/15 0454  Full code   Continuous     03/13/15 0453    Code Status History    Date Active Date Inactive Code Status Order ID Comments User Context   07/22/2014 11:56 AM 07/23/2014  8:09 PM Full Code [id number]  Aliene Beams, MD Inpatient    Advance Directive Documentation        Most Recent Value   Type of Advance Directive  Healthcare Power of Attorney, Living will   Pre-existing out of facility DNR order (yellow form or pink MOST form)     "MOST" Form in Place?          IV Access:    Peripheral IV   Procedures and diagnostic studies:   Ct Head Wo Contrast  03/13/2015  CLINICAL DATA:  Headache and fever.  Concern for meningitis. EXAM: CT HEAD WITHOUT CONTRAST TECHNIQUE: Contiguous axial images were obtained from the base of the skull through the vertex without intravenous contrast. COMPARISON:  None. FINDINGS: Skull and Sinuses:Negative for fracture or destructive process. No  acute sinusitis or mastoiditis where visualized. TMJ osteoarthritis. Visualized orbits: Bilateral cataract resection.  No acute finding. Brain: No evidence of acute infarction, hemorrhage, hydrocephalus, or mass lesion/mass effect. No evidence of sulcal or ventricular debris. Age congruent small-vessel ischemic change in the deep cerebral white matter. IMPRESSION: Unremarkable head CT for age. Electronically Signed   By: Marnee Spring M.D.   On: 03/13/2015 00:06   Dg Fluoro Guide Lumbar Puncture  03/14/2015  CLINICAL DATA:  Aseptic meningitis.  Headache. EXAM: DIAGNOSTIC LUMBAR PUNCTURE UNDER FLUOROSCOPIC GUIDANCE FLUOROSCOPY TIME:  Radiation Exposure Index (as provided by the fluoroscopic device): If the device does not provide the exposure index: Fluoroscopy Time (in minutes and seconds):  0 Min and 18 seconds Number of Acquired Images: PROCEDURE: Informed consent was obtained from the patient prior to the procedure, including potential complications of headache, allergy, and pain. With the patient prone, the lower back was prepped with Betadine. 1% Lidocaine was used for local anesthesia. Lumbar puncture was performed at the L3-4 level using a 20 gauge needle with return of clear CSF with an opening pressure of 29 cm water. 12.5 ml of CSF were obtained for laboratory studies. The patient tolerated the procedure well and there were no apparent complications. IMPRESSION: Successful lumbar puncture. Clear CSF was sent to the laboratory for analysis. Electronically Signed   By: Marlan Palau M.D.   On: 03/14/2015 10:24  Medical Consultants:    None.  Anti-Infectives:   Anti-infectives    Start     Dose/Rate Route Frequency Ordered Stop   03/13/15 2200  vancomycin (VANCOCIN) IVPB 750 mg/150 ml premix     750 mg 150 mL/hr over 60 Minutes Intravenous Every 12 hours 03/13/15 0509     03/13/15 1400  cefTRIAXone (ROCEPHIN) 2 g in dextrose 5 % 50 mL IVPB     2 g 100 mL/hr over 30 Minutes  Intravenous Every 12 hours 03/13/15 0451     03/13/15 0730  ampicillin (OMNIPEN) 2 g in sodium chloride 0.9 % 50 mL IVPB     2 g 150 mL/hr over 20 Minutes Intravenous 6 times per day 03/13/15 0724     03/13/15 0515  acyclovir (ZOVIRAX) 600 mg in dextrose 5 % 100 mL IVPB     600 mg 112 mL/hr over 60 Minutes Intravenous 3 times per day 03/13/15 0509     03/13/15 0215  vancomycin (VANCOCIN) 1,500 mg in sodium chloride 0.9 % 500 mL IVPB     1,500 mg 250 mL/hr over 120 Minutes Intravenous  Once 03/13/15 0203 03/13/15 0711   03/13/15 0215  cefTRIAXone (ROCEPHIN) 2 g in dextrose 5 % 50 mL IVPB     2 g 100 mL/hr over 30 Minutes Intravenous  Once 03/13/15 0203 03/13/15 1610      Subjective:    Wendy Mccormick neck pain and headache are improved. She is hungry and would like to eat.  Objective:    Filed Vitals:   03/13/15 2137 03/14/15 0144 03/14/15 0554 03/14/15 0948  BP: 162/65 137/54 159/76 141/85  Pulse: 88 87 88 89  Temp: 98.3 F (36.8 C) 98.8 F (37.1 C) 98.4 F (36.9 C) 98.2 F (36.8 C)  TempSrc: Oral Oral Oral   Resp: Height:      Weight:      SpO2: 94% 93% 93%     Intake/Output Summary (Last 24 hours) at 03/14/15 1037 Last data filed at 03/14/15 9604  Gross per 24 hour  Intake 2439.02 ml  Output   2520 ml  Net -80.98 ml   Filed Weights   03/13/15 0406  Weight: 94.212 kg (207 lb 11.2 oz)    Exam: Gen:  NAD Cardiovascular:  RRR, No M/R/G Chest and lungs:   CTAB Abdomen:  Abdomen soft, NT/ND, + BS Extremities:  No edema. Neuro: Awake alert and oriented 3.    Data Reviewed:    Labs: Basic Metabolic Panel:  Recent Labs Lab 03/13/15 0055 03/13/15 0601  NA 138 138  K 3.9 3.9  CL 105 102  CO2 25 25  GLUCOSE 110* 109*  BUN 12 9  CREATININE 0.85 0.85  CALCIUM 8.4* 8.4*   GFR Estimated Creatinine Clearance: 59 mL/min (by C-G formula based on Cr of 0.85). Liver Function Tests: No results for input(s): AST, ALT, ALKPHOS, BILITOT, PROT,  ALBUMIN in the last 168 hours. No results for input(s): LIPASE, AMYLASE in the last 168 hours. No results for input(s): AMMONIA in the last 168 hours. Coagulation profile  Recent Labs Lab 03/13/15 0055  INR 1.33    CBC:  Recent Labs Lab 03/13/15 0055 03/13/15 0601  WBC 5.6 5.6  NEUTROABS 4.2  --   HGB 11.8* 11.2*  HCT 36.8 33.6*  MCV 93.9 92.1  PLT 151 147*   Cardiac Enzymes: No results for input(s): CKTOTAL, CKMB, CKMBINDEX, TROPONINI in the last 168 hours. BNP (last 3 results)  No results for input(s): PROBNP in the last 8760 hours. CBG: No results for input(s): GLUCAP in the last 168 hours. D-Dimer: No results for input(s): DDIMER in the last 72 hours. Hgb A1c: No results for input(s): HGBA1C in the last 72 hours. Lipid Profile: No results for input(s): CHOL, HDL, LDLCALC, TRIG, CHOLHDL, LDLDIRECT in the last 72 hours. Thyroid function studies: No results for input(s): TSH, T4TOTAL, T3FREE, THYROIDAB in the last 72 hours.  Invalid input(s): FREET3 Anemia work up: No results for input(s): VITAMINB12, FOLATE, FERRITIN, TIBC, IRON, RETICCTPCT in the last 72 hours. Sepsis Labs:  Recent Labs Lab 03/13/15 0055 03/13/15 0601  WBC 5.6 5.6   Microbiology No results found for this or any previous visit (from the past 240 hour(s)).   Medications:   . acyclovir  600 mg Intravenous 3 times per day  . ampicillin (OMNIPEN) IV  2 g Intravenous 6 times per day  . antiseptic oral rinse  7 mL Mouth Rinse BID  . cefTRIAXone (ROCEPHIN)  IV  2 g Intravenous Q12H  . levothyroxine  125 mcg Oral QAC breakfast  . metoprolol succinate  100 mg Oral Daily  . sodium chloride flush  3 mL Intravenous Q12H  . vancomycin  750 mg Intravenous Q12H   Continuous Infusions: . sodium chloride 50 mL/hr at 03/13/15 0511    Time spent: 15 min   LOS: 1 day   Marinda Elk  [facility] Pager [phone]  *Please refer to [email or web address], password [email or web address] to get updated schedule on  who will round on this patient, as hospitalists switch teams weekly. If 7PM-7AM, please contact night-coverage at [email or web address], password [email or web address] for any overnight needs.  03/14/2015, 10:37 AM

## 2015-03-15 LAB — VDRL, CSF: VDRL Quant, CSF: NONREACTIVE

## 2015-03-15 MED ORDER — APIXABAN 5 MG PO TABS
5.0000 mg | ORAL_TABLET | Freq: Two times a day (BID) | ORAL | Status: DC
  Administered 2015-03-15 – 2015-03-17 (×5): 5 mg via ORAL
  Filled 2015-03-15 (×5): qty 1

## 2015-03-15 MED ORDER — HYDRALAZINE HCL 20 MG/ML IJ SOLN
5.0000 mg | Freq: Four times a day (QID) | INTRAMUSCULAR | Status: DC | PRN
  Administered 2015-03-16: 5 mg via INTRAVENOUS
  Filled 2015-03-15: qty 1

## 2015-03-15 MED ORDER — TRAMADOL HCL 50 MG PO TABS
100.0000 mg | ORAL_TABLET | Freq: Two times a day (BID) | ORAL | Status: DC
  Administered 2015-03-15 – 2015-03-17 (×5): 100 mg via ORAL
  Filled 2015-03-15 (×5): qty 2

## 2015-03-15 NOTE — Evaluation (Signed)
Physical Therapy Evaluation Patient Details Name: Wendy Mccormick MRN: [id number] DOB: 20-Feb-1930 Today's Date: 03/15/2015   History of Present Illness  Pt adm with mningitis. PMH - afib  Clinical Impression  Pt admitted with above diagnosis and presents to PT with functional limitations due to deficits listed below (See PT problem list). Pt needs skilled PT to maximize independence and safety to allow discharge to home with husband. Expect she will progress rapidly and will not need further PT at dc.     Follow Up Recommendations No PT follow up    Equipment Recommendations  None recommended by PT    Recommendations for Other Services       Precautions / Restrictions Precautions Precautions: None      Mobility  Bed Mobility Overal bed mobility: Independent                Transfers Overall transfer level: Modified independent                  Ambulation/Gait Ambulation/Gait assistance: Min guard;Modified independent (Device/Increase time) Ambulation Distance (Feet): 170 Feet Assistive device: None Gait Pattern/deviations: Step-through pattern;Decreased stride length Gait velocity: decr   General Gait Details: In room pt steady and modified independent. In hall as pt fatigued was slightly unsteady and min guard.  Stairs            Wheelchair Mobility    Modified Rankin (Stroke Patients Only)       Balance Overall balance assessment: Needs assistance Sitting-balance support: Feet supported;No upper extremity supported Sitting balance-Leahy Scale: Normal     Standing balance support: No upper extremity supported Standing balance-Leahy Scale: Good                               Pertinent Vitals/Pain Pain Assessment: Faces Faces Pain Scale: Hurts even more Pain Location: head and neck Pain Descriptors / Indicators: Aching Pain Intervention(s): Limited activity within patient's tolerance;Repositioned    Home Living  Family/patient expects to be discharged to:: Private residence Living Arrangements: Spouse/significant other Available Help at Discharge: Family Type of Home: House Home Access: Stairs to enter Entrance Stairs-Rails: Right Entrance Stairs-Number of Steps: 2 Home Layout: Two level;Able to live on main level with bedroom/bathroom Home Equipment: None      Prior Function Level of Independence: Independent               Hand Dominance        Extremity/Trunk Assessment   Upper Extremity Assessment: Overall WFL for tasks assessed           Lower Extremity Assessment: Generalized weakness      Cervical / Trunk Assessment: Other exceptions  Communication   Communication: No difficulties  Cognition Arousal/Alertness: Awake/alert Behavior During Therapy: WFL for tasks assessed/performed Overall Cognitive Status: Within Functional Limits for tasks assessed                      General Comments      Exercises        Assessment/Plan    PT Assessment Patient needs continued PT services  PT Diagnosis Difficulty walking   PT Problem List Decreased strength;Decreased activity tolerance;Decreased balance;Decreased mobility  PT Treatment Interventions Gait training;DME instruction;Functional mobility training;Therapeutic activities;Therapeutic exercise;Balance training;Patient/family education   PT Goals (Current goals can be found in the Care Plan section) Acute Rehab PT Goals Patient Stated Goal: go home PT Goal Formulation: With patient Time For  Goal Achievement: 03/22/15 Potential to Achieve Goals: Good    Frequency Min 3X/week   Barriers to discharge        Co-evaluation               End of Session   Activity Tolerance: Patient tolerated treatment well Patient left: in bed;with call bell/phone within reach;with bed alarm set           Time: 4098-1191 PT Time Calculation (min) (ACUTE ONLY): 15 min   Charges:   PT Evaluation $PT  Eval Moderate Complexity: 1 Procedure     PT G Codes:        [place] 2015/04/12, 5:13 PM [facility] PT [phone]

## 2015-03-15 NOTE — Progress Notes (Signed)
Patient earlier complained of her head kind swimmy, slight dizzy, SBP was taken twice right arm was >160 and the left >180's. Called Wendy Mccormick clarified order of hydralazine. With orders made, will check BP first before giving PRN dose. See flow sheet for vitals signs. Will continue to monitor patient.

## 2015-03-15 NOTE — Progress Notes (Signed)
[facility] PROGRESS NOTE    Progress Note   Zita Ozimek XBJ:[id number] DOB: 14-May-1930 DOA: 03/12/2015 PCP: Cheral Bay, MD   Brief Narrative:   Lateka Rady is an 82 y.o. female past medical history of A. [facility] on [facility] hypointense to code she was experiencing severe headache with neck pain. She also had a temperature 1.6.  Assessment/Plan:   Aseptic meningitis: Started empirically on IV vancomycin, Rocephin, acyclovir and add ampicillin. LP done on 03/14/2015 DC vancomycin and Rocephin. Continue acyclovir, awaiting results of HSV. DC oxycodone that she doesn't want it, we'll start her on tramadol.  Essential Hypertension Continue metoprolol.  Chronic Atrial fibrillation (HCC) Resume Eluquis  Hypothyroidism Continue Synthroid.   DVT Prophylaxis Continue Eluquis  Family Communication: none Disposition Plan: Home 1 days Code Status:     Code Status Orders        Start     Ordered   03/13/15 0454  Full code   Continuous     03/13/15 0453    Code Status History    Date Active Date Inactive Code Status Order ID Comments User Context   07/22/2014 11:56 AM 07/23/2014  8:09 PM Full Code [id number]  Aliene Beams, MD Inpatient    Advance Directive Documentation        Most Recent Value   Type of Advance Directive  Healthcare Power of Attorney, Living will   Pre-existing out of facility DNR order (yellow form or pink MOST form)     "MOST" Form in Place?          IV Access:    Peripheral IV   Procedures and diagnostic studies:   Dg Fluoro Guide Lumbar Puncture  03/14/2015  CLINICAL DATA:  Aseptic meningitis.  Headache. EXAM: DIAGNOSTIC LUMBAR PUNCTURE UNDER FLUOROSCOPIC GUIDANCE FLUOROSCOPY TIME:  Radiation Exposure Index (as provided by the fluoroscopic device): If the device does not provide the exposure index: Fluoroscopy Time (in minutes and seconds):  0 Min and 18 seconds Number of Acquired Images: PROCEDURE: Informed consent was  obtained from the patient prior to the procedure, including potential complications of headache, allergy, and pain. With the patient prone, the lower back was prepped with Betadine. 1% Lidocaine was used for local anesthesia. Lumbar puncture was performed at the L3-4 level using a 20 gauge needle with return of clear CSF with an opening pressure of 29 cm water. 12.5 ml of CSF were obtained for laboratory studies. The patient tolerated the procedure well and there were no apparent complications. IMPRESSION: Successful lumbar puncture. Clear CSF was sent to the laboratory for analysis. Electronically Signed   By: Marlan Palau M.D.   On: 03/14/2015 10:24     Medical Consultants:    None.  Anti-Infectives:   Anti-infectives    Start     Dose/Rate Route Frequency Ordered Stop   03/13/15 2200  vancomycin (VANCOCIN) IVPB 750 mg/150 ml premix     750 mg 150 mL/hr over 60 Minutes Intravenous Every 12 hours 03/13/15 0509     03/13/15 1400  cefTRIAXone (ROCEPHIN) 2 g in dextrose 5 % 50 mL IVPB     2 g 100 mL/hr over 30 Minutes Intravenous Every 12 hours 03/13/15 0451     03/13/15 0730  ampicillin (OMNIPEN) 2 g in sodium chloride 0.9 % 50 mL IVPB     2 g 150 mL/hr over 20 Minutes Intravenous 6 times per day 03/13/15 0724     03/13/15 0515  acyclovir (ZOVIRAX) 600 mg in  dextrose 5 % 100 mL IVPB     600 mg 112 mL/hr over 60 Minutes Intravenous 3 times per day 03/13/15 0509     03/13/15 0215  vancomycin (VANCOCIN) 1,500 mg in sodium chloride 0.9 % 500 mL IVPB     1,500 mg 250 mL/hr over 120 Minutes Intravenous  Once 03/13/15 0203 03/13/15 0711   03/13/15 0215  cefTRIAXone (ROCEPHIN) 2 g in dextrose 5 % 50 mL IVPB     2 g 100 mL/hr over 30 Minutes Intravenous  Once 03/13/15 0203 03/13/15 2130      Subjective:    Aava Deland she relates she continues to have headaches.  Objective:    Filed Vitals:   03/14/15 1321 03/14/15 2243 03/14/15 2358 03/15/15 0440  BP: 160/79 143/76 170/84 168/78    Pulse: 79 88 94 81  Temp: 97.9 F (36.6 C) 98.6 F (37 C) 98.1 F (36.7 C) 98.4 F (36.9 C)  TempSrc: Oral Oral Oral Oral  Resp: 19 18 16 16   Height:      Weight:      SpO2: 94% 92% 92% 94%    Intake/Output Summary (Last 24 hours) at 03/15/15 0947 Last data filed at 03/15/15 0700  Gross per 24 hour  Intake 2415.17 ml  Output   2700 ml  Net -284.83 ml   Filed Weights   03/13/15 0406  Weight: 94.212 kg (207 lb 11.2 oz)    Exam: Gen:  NAD Cardiovascular:  RRR, No M/R/G Chest and lungs:   CTAB Abdomen:  Abdomen soft, NT/ND, + BS Extremities:  No edema. Neuro: Awake alert and oriented 3, she is complaining of photophobia, she relates her headaches are improved but continues to have neck pain. She has no rigidity and nonfocal.    Data Reviewed:    Labs: Basic Metabolic Panel:  Recent Labs Lab 03/13/15 0055 03/13/15 0601  NA 138 138  K 3.9 3.9  CL 105 102  CO2 25 25  GLUCOSE 110* 109*  BUN 12 9  CREATININE 0.85 0.85  CALCIUM 8.4* 8.4*   GFR Estimated Creatinine Clearance: 59 mL/min (by C-G formula based on Cr of 0.85). Liver Function Tests: No results for input(s): AST, ALT, ALKPHOS, BILITOT, PROT, ALBUMIN in the last 168 hours. No results for input(s): LIPASE, AMYLASE in the last 168 hours. No results for input(s): AMMONIA in the last 168 hours. Coagulation profile  Recent Labs Lab 03/13/15 0055  INR 1.33    CBC:  Recent Labs Lab 03/13/15 0055 03/13/15 0601  WBC 5.6 5.6  NEUTROABS 4.2  --   HGB 11.8* 11.2*  HCT 36.8 33.6*  MCV 93.9 92.1  PLT 151 147*   Cardiac Enzymes: No results for input(s): CKTOTAL, CKMB, CKMBINDEX, TROPONINI in the last 168 hours. BNP (last 3 results) No results for input(s): PROBNP in the last 8760 hours. CBG: No results for input(s): GLUCAP in the last 168 hours. D-Dimer: No results for input(s): DDIMER in the last 72 hours. Hgb A1c: No results for input(s): HGBA1C in the last 72 hours. Lipid Profile: No  results for input(s): CHOL, HDL, LDLCALC, TRIG, CHOLHDL, LDLDIRECT in the last 72 hours. Thyroid function studies: No results for input(s): TSH, T4TOTAL, T3FREE, THYROIDAB in the last 72 hours.  Invalid input(s): FREET3 Anemia work up: No results for input(s): VITAMINB12, FOLATE, FERRITIN, TIBC, IRON, RETICCTPCT in the last 72 hours. Sepsis Labs:  Recent Labs Lab 03/13/15 0055 03/13/15 0601  WBC 5.6 5.6   Microbiology Recent Results (from the  past 240 hour(s))  CSF culture     Status: None (Preliminary result)   Collection Time: 03/14/15  9:25 AM  Result Value Ref Range Status   Specimen Description CSF  Final   Special Requests NONE  Final   Gram Stain   Final    CYTOSPIN SMEAR WBC PRESENT, PREDOMINANTLY MONONUCLEAR NO ORGANISMS SEEN    Culture NO GROWTH < 24 HOURS  Final   Report Status PENDING  Incomplete     Medications:   . acyclovir  600 mg Intravenous 3 times per day  . ampicillin (OMNIPEN) IV  2 g Intravenous 6 times per day  . antiseptic oral rinse  7 mL Mouth Rinse BID  . apixaban  5 mg Oral BID  . cefTRIAXone (ROCEPHIN)  IV  2 g Intravenous Q12H  . levothyroxine  125 mcg Oral QAC breakfast  . metoprolol succinate  100 mg Oral Daily  . sodium chloride flush  3 mL Intravenous Q12H  . vancomycin  750 mg Intravenous Q12H   Continuous Infusions: . sodium chloride 50 mL/hr (03/15/15 0622)    Time spent: 25 min   LOS: 2 days   Marinda Elk  [facility] Pager [phone]  *Please refer to [email or web address], password [email or web address] to get updated schedule on who will round on this patient, as hospitalists switch teams weekly. If 7PM-7AM, please contact night-coverage at [email or web address], password [email or web address] for any overnight needs.  03/15/2015, 9:47 AM

## 2015-03-16 DIAGNOSIS — G030 Nonpyogenic meningitis: Principal | ICD-10-CM

## 2015-03-16 MED ORDER — BISACODYL 5 MG PO TBEC
10.0000 mg | DELAYED_RELEASE_TABLET | Freq: Once | ORAL | Status: AC
  Administered 2015-03-16: 10 mg via ORAL
  Filled 2015-03-16: qty 2

## 2015-03-16 MED ORDER — DOCUSATE SODIUM 100 MG PO CAPS
100.0000 mg | ORAL_CAPSULE | Freq: Two times a day (BID) | ORAL | Status: DC
  Administered 2015-03-16 – 2015-03-17 (×2): 100 mg via ORAL
  Filled 2015-03-16 (×2): qty 1

## 2015-03-16 NOTE — Progress Notes (Signed)
Patient is refusing to bed on the bed alrm. She had to go to the bathroom frequently and has urgency and cant not wait. She understand that she is a fall risk and stated she will sign any paper work necessary to be off of this precaution.

## 2015-03-16 NOTE — Progress Notes (Signed)
Pharmacy Antibiotic Note  Wendy Mccormick is a 82 y.o. female admitted on 03/12/2015 with r/omeningitis - presented to [facility] with 3 days of fever/neck pain. Likely viral etiology but no HSV-1 lab checked. Pharmacy has been consulted for Acyclovir dosing - day #4. Afeb, WBC WNL. Renal function wnl stable. CSF fluid analysis wnl.  2/5 Ampicillin >>2/7 2/5 Acyclovir >>  2/5 CTX >>2/7 2/5 Vancomycin >>2/7  2/5: CSF: ngtd  Plan: - Acyclovir  IV q8h (~10mg /kg IBW) - Monitor clinical progress, c/s, renal function, abx plan/LOT - Check HSV-1? MD never put in lab. Likely home tomorrow  Height:  (167.6 cm) Weight: 207 lb 11.2 oz (94.212 kg) IBW/kg (Calculated) : 59.3  Temp (24hrs), Avg:98.5 F (36.9 C), Min:98.2 F (36.8 C), Max:99 F (37.2 C)   Recent Labs Lab 03/13/15 0055 03/13/15 0601  WBC 5.6 5.6  CREATININE 0.85 0.85    Estimated Creatinine Clearance: 59 mL/min (by C-G formula based on Cr of 0.85).    Allergies  Allergen Reactions  . Hydrocodone Nausea And Vomiting   Babs Bertin, PharmD, [facility] Clinical Pharmacist Pager [phone] 03/16/2015 10:31 AM

## 2015-03-16 NOTE — Progress Notes (Signed)
PROGRESS NOTE  Wendy Mccormick ZOX:[id number] DOB: 07-26-1930 DOA: 03/12/2015 PCP: Cheral Bay, MD  HPI: Wendy Mccormick is an 82 y.o. female past medical history of A. fib on Eluquis seen at [facility] she was experiencing severe headache with neck pain and admitted with concern for meningitis.  Subjective / 24 H Interval events - nausea/vomiting last night, avoiding food this morning  Assessment/Plan: Principal Problem:   Aseptic meningitis Active Problems:   Hypertension   Atrial fibrillation (HCC)   Anticoagulant long-term use   Hypothyroidism   Febrile illness   Headache   Aseptic meningitis - Started empirically on IV vancomycin, Rocephin, acyclovir and ampicillin, LP done on 03/14/2015 without significant evidence for bacterial meningitis, DC vancomycin and Rocephin. Continue acyclovir, awaiting results of HSV however this was ordered today 2/8 as an add-on.  Nausea / vomiting - likely in the setting of #1, supportive care  Essential Hypertension - Continue metoprolol.  Chronic Atrial fibrillation (HCC) - Resume Eluquis  Hypothyroidism - Continue Synthroid.   Diet: Diet regular Room service appropriate?: Yes; Fluid consistency:: Thin Fluids: NS DVT Prophylaxis: Eliquis  Code Status: Full Code Family Communication: no family bedside  Disposition Plan: home in 1 day  Barriers to discharge: nausea/vomiting/neck pain  Consultants:  None   Procedures:  LP   Antibiotics  Anti-infectives    Start     Dose/Rate Route Frequency Ordered Stop   03/13/15 2200  vancomycin (VANCOCIN) IVPB 750 mg/150 ml premix  Status:  Discontinued     750 mg 150 mL/hr over 60 Minutes Intravenous Every 12 hours 03/13/15 0509 03/15/15 1003   03/13/15 1400  cefTRIAXone (ROCEPHIN) 2 g in dextrose 5 % 50 mL IVPB  Status:  Discontinued     2 g 100 mL/hr over 30 Minutes Intravenous Every 12 hours 03/13/15 0451 03/15/15 1003   03/13/15 0730  ampicillin (OMNIPEN) 2 g in sodium chloride 0.9 % 50  mL IVPB  Status:  Discontinued     2 g 150 mL/hr over 20 Minutes Intravenous 6 times per day 03/13/15 0724 03/15/15 1035   03/13/15 0515  acyclovir (ZOVIRAX) 600 mg in dextrose 5 % 100 mL IVPB     600 mg 112 mL/hr over 60 Minutes Intravenous 3 times per day 03/13/15 0509     03/13/15 0215  vancomycin (VANCOCIN) 1,500 mg in sodium chloride 0.9 % 500 mL IVPB     1,500 mg 250 mL/hr over 120 Minutes Intravenous  Once 03/13/15 0203 03/13/15 0711   03/13/15 0215  cefTRIAXone (ROCEPHIN) 2 g in dextrose 5 % 50 mL IVPB     2 g 100 mL/hr over 30 Minutes Intravenous  Once 03/13/15 0203 03/13/15 0307      Studies  No results found.  Objective  Filed Vitals:   03/16/15 0150 03/16/15 0500 03/16/15 0921 03/16/15 0921  BP: 168/85 145/76 156/80 156/80  Pulse: 76 78  76  Temp:  98.2 F (36.8 C)    TempSrc:  Oral    Resp:  18    Height:      Weight:      SpO2: 94% 96%      Intake/Output Summary (Last 24 hours) at 03/16/15 1308 Last data filed at 03/16/15 1006  Gross per 24 hour  Intake   1012 ml  Output   1000 ml  Net     12 ml   Filed Weights   03/13/15 0406  Weight: 94.212 kg (207 lb 11.2 oz)   Exam:  GENERAL: NAD  HEENT: no scleral icterus, PERRL  LUNGS: CTA biL, no wheezing  HEART: RRR without MRG  ABDOMEN: soft, non tender  EXTREMITIES: no clubbing / cyanosis  NEUROLOGIC: non focal  Data Reviewed: Basic Metabolic Panel:  Recent Labs Lab 03/13/15 0055 03/13/15 0601  NA 138 138  K 3.9 3.9  CL 105 102  CO2 25 25  GLUCOSE 110* 109*  BUN 12 9  CREATININE 0.85 0.85  CALCIUM 8.4* 8.4*   CBC:  Recent Labs Lab 03/13/15 0055 03/13/15 0601  WBC 5.6 5.6  NEUTROABS 4.2  --   HGB 11.8* 11.2*  HCT 36.8 33.6*  MCV 93.9 92.1  PLT 151 147*    Recent Results (from the past 240 hour(s))  CSF culture     Status: None (Preliminary result)   Collection Time: 03/14/15  9:25 AM  Result Value Ref Range Status   Specimen Description CSF  Final   Special Requests  NONE  Final   Gram Stain   Final    CYTOSPIN SMEAR WBC PRESENT, PREDOMINANTLY MONONUCLEAR NO ORGANISMS SEEN    Culture NO GROWTH 2 DAYS  Final   Report Status PENDING  Incomplete     Scheduled Meds: . acyclovir  600 mg Intravenous 3 times per day  . antiseptic oral rinse  7 mL Mouth Rinse BID  . apixaban  5 mg Oral BID  . levothyroxine  125 mcg Oral QAC breakfast  . metoprolol succinate  100 mg Oral Daily  . sodium chloride flush  3 mL Intravenous Q12H  . traMADol  100 mg Oral Q12H   Continuous Infusions: . sodium chloride 50 mL/hr at 03/15/15 2300   Time spent: 25 minutes, > 50% bedside  Pamella Pert, MD [facility] Pager [phone]. If 7 PM - 7 AM, please contact night-coverage at [email or web address], password [facility] 03/16/2015, 1:08 PM  LOS: 3 days

## 2015-03-17 LAB — CSF CULTURE W GRAM STAIN: Culture: NO GROWTH

## 2015-03-17 MED ORDER — VALACYCLOVIR HCL 500 MG PO TABS: 500.0000 mg | ORAL_TABLET | Freq: Two times a day (BID) | ORAL | Status: AC

## 2015-03-17 MED ORDER — ONDANSETRON HCL 4 MG PO TABS: 4.0000 mg | ORAL_TABLET | Freq: Four times a day (QID) | ORAL | Status: AC | PRN

## 2015-03-17 MED ORDER — TRAMADOL HCL 50 MG PO TABS: 50.0000 mg | ORAL_TABLET | Freq: Two times a day (BID) | ORAL | Status: AC | PRN

## 2015-03-17 MED ORDER — POLYETHYLENE GLYCOL 3350 17 G PO PACK
17.0000 g | PACK | Freq: Two times a day (BID) | ORAL | Status: DC
  Administered 2015-03-17: 17 g via ORAL
  Filled 2015-03-17: qty 1

## 2015-03-17 NOTE — Progress Notes (Signed)
   03/17/15 1033  PT Visit Information  Reason Eval/Treat Not Completed Other (comment) (Pt refused tx at this time.  pt set for d/c home and reports she feels confident with her mobility at home.  )

## 2015-03-17 NOTE — Progress Notes (Signed)
Benefits check for Xarelto  daily : Pt copay will be $45- prior Berkley Harvey is required [phone] . Gae Gallop RN,BSN,CM [phone]

## 2015-03-17 NOTE — Discharge Summary (Signed)
Physician Discharge Summary  Wendy Mccormick WUJ:[id number] DOB: 15-Dec-1930 DOA: 03/12/2015  PCP: Cheral Bay, MD  Admit date: 03/12/2015 Discharge date: 03/17/2015  Time spent: > 30 minutes  Recommendations for Outpatient Follow-up:  1. Follow up with PCP in 1-3 weeks   Discharge Diagnoses:  Principal Problem:   Aseptic meningitis Active Problems:   Hypertension   Atrial fibrillation (HCC)   Anticoagulant long-term use   Hypothyroidism   Febrile illness   Headache  Discharge Condition: stable  Diet recommendation: regular  Filed Weights   03/13/15 0406  Weight: 94.212 kg (207 lb 11.2 oz)    History of present illness:  See H&P, Labs, Consult and Test reports for all details in brief, patient is a 82 y.o. female past medical history of A. fib on Eluquis seen at [facility] she was experiencing severe headache with neck pain and admitted with concern for meningitis.  Hospital Course:  Aseptic meningitis - Started empirically on IV vancomycin, Rocephin, acyclovir and ampicillin, LP done on 03/14/2015 without significant evidence for bacterial meningitis, DC vancomycin and Rocephin. Continue acyclovir, awaiting results of HSV however this was ordered on 2/8 as an add-on and unlikely it will results if not sufficient sample. She will be treated empirically with Valtrex for a week. She still has mild HA however improving overall.  Nausea / vomiting - likely in the setting of #1, supportive care, minimize tramadol use as it can be a side effect.  Essential Hypertension - Continue metoprolol. Chronic Atrial fibrillation (HCC) - Resume Eliquis Hypothyroidism - Continue Synthroid.   Procedures:  LP   Consultations:  Neurology   Discharge Exam: Filed Vitals:   03/16/15 2140 03/17/15 0213 03/17/15 0503 03/17/15 0928  BP: 193/95 167/79 148/77 155/74  Pulse: 88 84 76 80  Temp: 99.2 F (37.3 C) 98.2 F (36.8 C) 98 F (36.7 C) 97.8 F (36.6 C)  TempSrc: Oral Oral Oral Oral  Resp:  Height:      Weight:      SpO2: 90% 91% 94% 93%    General: NAD Cardiovascular: RRR Respiratory: CTA biL  Discharge Instructions Activity:  As tolerated   Get Medicines reviewed and adjusted: Please take all your medications with you for your next visit with your Primary MD  Please request your Primary MD to go over all hospital tests and procedure/radiological results at the follow up, please ask your Primary MD to get all Hospital records sent to his/her office.  If you experience worsening of your admission symptoms, develop shortness of breath, life threatening emergency, suicidal or homicidal thoughts you must seek medical attention immediately by calling 911 or calling your MD immediately if symptoms less severe.  You must read complete instructions/literature along with all the possible adverse reactions/side effects for all the Medicines you take and that have been prescribed to you. Take any new Medicines after you have completely understood and accpet all the possible adverse reactions/side effects.   Do not drive when taking Pain medications.   Do not take more than prescribed Pain, Sleep and Anxiety Medications  Special Instructions: If you have smoked or chewed Tobacco in the last 2 yrs please stop smoking, stop any regular Alcohol and or any Recreational drug use.  Wear Seat belts while driving.  Please note  You were cared for by a hospitalist during your hospital stay. Once you are discharged, your primary care physician will handle any further medical issues. Please note that NO REFILLS for any discharge  medications will be authorized once you are discharged, as it is imperative that you return to your primary care physician (or establish a relationship with a primary care physician if you do not have one) for your aftercare needs so that they can reassess your need for medications and monitor your lab values.    Medication List    TAKE these  medications        ELIQUIS 5 MG Tabs tablet  Generic drug:  apixaban  Take 5 mg by mouth 2 (two) times daily.     gabapentin 300 MG capsule  Commonly known as:  NEURONTIN  Take 300 mg by mouth daily.     levothyroxine 125 MCG tablet  Commonly known as:  SYNTHROID, LEVOTHROID  Take 125 mcg by mouth daily.     losartan-hydrochlorothiazide 50-12.5 MG tablet  Commonly known as:  HYZAAR  Take 1 tablet by mouth daily.     metoprolol succinate 100 MG 24 hr tablet  Commonly known as:  TOPROL-XL  Take 100 mg by mouth daily.     ondansetron 4 MG tablet  Commonly known as:  ZOFRAN  Take 1 tablet (4 mg total) by mouth every 6 (six) hours as needed for nausea.     traMADol 50 MG tablet  Commonly known as:  ULTRAM  Take 1 tablet (50 mg total) by mouth every 12 (twelve) hours as needed.     valACYclovir 500 MG tablet  Commonly known as:  VALTREX  Take 1 tablet (500 mg total) by mouth 2 (two) times daily.           Follow-up Information    Follow up with Cheral Bay, MD. Schedule an appointment as soon as possible for a visit on 03/24/2015.   Specialty:  Family Medicine   Why:  Appointment with Dr. Lendon Colonel PA is on 03/24/15 at [facility] information:   [address] [facility] [place] [address] [phone]       The results of significant diagnostics from this hospitalization (including imaging, microbiology, ancillary and laboratory) are listed below for reference.    Significant Diagnostic Studies: Ct Head Wo Contrast  03/13/2015  CLINICAL DATA:  Headache and fever.  Concern for meningitis. EXAM: CT HEAD WITHOUT CONTRAST TECHNIQUE: Contiguous axial images were obtained from the base of the skull through the vertex without intravenous contrast. COMPARISON:  None. FINDINGS: Skull and Sinuses:Negative for fracture or destructive process. No acute sinusitis or mastoiditis where visualized. TMJ osteoarthritis. Visualized orbits: Bilateral cataract resection.   No acute finding. Brain: No evidence of acute infarction, hemorrhage, hydrocephalus, or mass lesion/mass effect. No evidence of sulcal or ventricular debris. Age congruent small-vessel ischemic change in the deep cerebral white matter. IMPRESSION: Unremarkable head CT for age. Electronically Signed   By: Marnee Spring M.D.   On: 03/13/2015 00:06   Dg Fluoro Guide Lumbar Puncture  03/14/2015  CLINICAL DATA:  Aseptic meningitis.  Headache. EXAM: DIAGNOSTIC LUMBAR PUNCTURE UNDER FLUOROSCOPIC GUIDANCE FLUOROSCOPY TIME:  Radiation Exposure Index (as provided by the fluoroscopic device): If the device does not provide the exposure index: Fluoroscopy Time (in minutes and seconds):  0 Min and 18 seconds Number of Acquired Images: PROCEDURE: Informed consent was obtained from the patient prior to the procedure, including potential complications of headache, allergy, and pain. With the patient prone, the lower back was prepped with Betadine. 1% Lidocaine was used for local anesthesia. Lumbar puncture was performed at the L3-4 level using a 20 gauge  needle with return of clear CSF with an opening pressure of 29 cm water. 12.5 ml of CSF were obtained for laboratory studies. The patient tolerated the procedure well and there were no apparent complications. IMPRESSION: Successful lumbar puncture. Clear CSF was sent to the laboratory for analysis. Electronically Signed   By: Marlan Palau M.D.   On: 03/14/2015 10:24    Microbiology: Recent Results (from the past 240 hour(s))  CSF culture     Status: None   Collection Time: 03/14/15  9:25 AM  Result Value Ref Range Status   Specimen Description CSF  Final   Special Requests NONE  Final   Gram Stain   Final    CYTOSPIN SMEAR WBC PRESENT, PREDOMINANTLY MONONUCLEAR NO ORGANISMS SEEN    Culture NO GROWTH 3 DAYS  Final   Report Status 03/17/2015 FINAL  Final     Labs: Basic Metabolic Panel:  Recent Labs Lab 03/13/15 0055 03/13/15 0601  NA 138 138  K 3.9  3.9  CL 105 102  CO2 25 25  GLUCOSE 110* 109*  BUN 12 9  CREATININE 0.85 0.85  CALCIUM 8.4* 8.4*   CBC:  Recent Labs Lab 03/13/15 0055 03/13/15 0601  WBC 5.6 5.6  NEUTROABS 4.2  --   HGB 11.8* 11.2*  HCT 36.8 33.6*  MCV 93.9 92.1  PLT 151 147*     Signed:  GHERGHE, COSTIN  [facility] 03/17/2015, 2:10 PM

## 2015-03-17 NOTE — Progress Notes (Signed)
Reviewed discharge paperwork and prescriptions with patient.  PIV removed.  Pt denied any needs at this time.  Patient taken to discharge location via wheelchair.

## 2015-03-17 NOTE — Progress Notes (Signed)
Per Dr Aronson peer to peer approved INPT 

## 2015-03-18 LAB — HERPES SIMPLEX VIRUS(HSV) DNA BY PCR
HSV 1 DNA: NEGATIVE
HSV 2 DNA: NEGATIVE

## 2017-06-06 ENCOUNTER — Other Ambulatory Visit: Payer: Self-pay

## 2017-06-06 ENCOUNTER — Emergency Department (HOSPITAL_BASED_OUTPATIENT_CLINIC_OR_DEPARTMENT_OTHER)
Admission: EM | Admit: 2017-06-06 | Discharge: 2017-06-06 | Disposition: A | Discharge status: Home / Self Care | Payer: Medicare Other | Attending: Emergency Medicine | Admitting: Emergency Medicine

## 2017-06-06 ENCOUNTER — Encounter (HOSPITAL_BASED_OUTPATIENT_CLINIC_OR_DEPARTMENT_OTHER): Payer: Self-pay | Admitting: *Deleted

## 2017-06-06 DIAGNOSIS — Z79899 Other long term (current) drug therapy: Secondary | ICD-10-CM | POA: Insufficient documentation

## 2017-06-06 DIAGNOSIS — Z7901 Long term (current) use of anticoagulants: Secondary | ICD-10-CM | POA: Insufficient documentation

## 2017-06-06 DIAGNOSIS — W2209XA Striking against other stationary object, initial encounter: Secondary | ICD-10-CM | POA: Insufficient documentation

## 2017-06-06 DIAGNOSIS — Y999 Unspecified external cause status: Secondary | ICD-10-CM | POA: Insufficient documentation

## 2017-06-06 DIAGNOSIS — Z87891 Personal history of nicotine dependence: Secondary | ICD-10-CM | POA: Insufficient documentation

## 2017-06-06 DIAGNOSIS — Y939 Activity, unspecified: Secondary | ICD-10-CM | POA: Diagnosis not present

## 2017-06-06 DIAGNOSIS — I10 Essential (primary) hypertension: Secondary | ICD-10-CM | POA: Insufficient documentation

## 2017-06-06 DIAGNOSIS — E039 Hypothyroidism, unspecified: Secondary | ICD-10-CM | POA: Diagnosis not present

## 2017-06-06 DIAGNOSIS — S81812A Laceration without foreign body, left lower leg, initial encounter: Secondary | ICD-10-CM | POA: Insufficient documentation

## 2017-06-06 DIAGNOSIS — Y929 Unspecified place or not applicable: Secondary | ICD-10-CM | POA: Insufficient documentation

## 2017-06-06 NOTE — ED Provider Notes (Signed)
[facility] EMERGENCY DEPARTMENT Provider Note   CSN: [id number] Arrival date & time: 06/06/17  1740     History   Chief Complaint Chief Complaint  Patient presents with  . Laceration    HPI Wendy Mccormick is a 84 y.o. female.  The history is provided by the patient and medical records.  Laceration   The incident occurred 3 to 5 hours ago. The laceration is located on the left leg. The laceration is 2 cm in size. The laceration mechanism was a a blunt object. The pain is at a severity of 1/10. The pain is mild. The pain has been constant since onset. She reports no foreign bodies present. Her tetanus status is UTD.    Past Medical History:  Diagnosis Date  . Arthritis   . Colon polyps   . Dysrhythmia   . GERD (gastroesophageal reflux disease)   . History of bronchitis   . Hypertension    metoprolol  . Hypothyroidism   . Shortness of breath dyspnea   . Urinary incontinence     Patient Active Problem List   Diagnosis Date Noted  . Aseptic meningitis 03/13/2015  . Hypertension 03/13/2015  . Atrial fibrillation (HCC) 03/13/2015  . Anticoagulant long-term use 03/13/2015  . Hypothyroidism 03/13/2015  . Febrile illness 03/13/2015  . Headache 03/13/2015  . Lumbar herniated disc 07/22/2014    Past Surgical History:  Procedure Laterality Date  . BACK SURGERY    . CATARACT EXTRACTION Bilateral   . COLONOSCOPY    . ESOPHAGOGASTRODUODENOSCOPY    . LUMBAR LAMINECTOMY/DECOMPRESSION MICRODISCECTOMY Left 07/22/2014   Procedure: Microdiscectomy - left - L4-L5 extra foraminal;  Surgeon: Aliene Beams, MD;  Location: [facility];  Service: Neurosurgery;  Laterality: Left;  . TUMOR REMOVAL Right    removed from behind right ear     OB History   None      Home Medications    Prior to Admission medications   Medication Sig Start Date End Date Taking? Authorizing Provider  apixaban (ELIQUIS) 5 MG TABS tablet Take 5 mg by mouth 2 (two) times daily.    [provider]  gabapentin (NEURONTIN) 300 MG capsule Take 300 mg by mouth daily.    [provider]  levothyroxine (SYNTHROID, LEVOTHROID) 125 MCG tablet Take 125 mcg by mouth daily.    [provider]  losartan-hydrochlorothiazide (HYZAAR) 50-12.5 MG tablet Take 1 tablet by mouth daily. 02/28/15   [provider]  metoprolol succinate (TOPROL-XL) 100 MG 24 hr tablet Take 100 mg by mouth daily.    [provider]  ondansetron (ZOFRAN) 4 MG tablet Take 1 tablet (4 mg total) by mouth every 6 (six) hours as needed for nausea. 03/17/15   Leatha Gilding, MD  traMADol (ULTRAM) 50 MG tablet Take 1 tablet (50 mg total) by mouth every 12 (twelve) hours as needed. 03/17/15   Leatha Gilding, MD  valACYclovir (VALTREX) 500 MG tablet Take 1 tablet (500 mg total) by mouth 2 (two) times daily. 03/17/15   Leatha Gilding, MD    Family History No family history on file.  Social History Social History   Tobacco Use  . Smoking status: Former Smoker    Last attempt to quit: 02/05/1978    Years since quitting: 39.3  . Smokeless tobacco: Never Used  Substance Use Topics  . Alcohol use: No  . Drug use: No     Allergies   Hydrocodone   Review of Systems Review of  Systems  Constitutional: Negative for chills.  HENT: Negative for ear pain.   Respiratory: Negative for cough and shortness of breath.   Cardiovascular: Negative for chest pain and palpitations.  Gastrointestinal: Negative for abdominal pain, nausea and vomiting.  Genitourinary: Negative for dysuria and hematuria.  Musculoskeletal: Negative for back pain.  Skin: Positive for wound. Negative for rash.  Neurological: Negative for light-headedness and headaches.  All other systems reviewed and are negative.    Physical Exam Updated Vital Signs BP (!) 158/81 (BP Location: Left Arm)   Pulse 78   Temp 98.1 F (36.7 C) (Oral)   Resp 18   Ht  (1.651 m)   Wt 89.4 kg (197 lb)   SpO2 98%   BMI  32.78 kg/m   Physical Exam  Constitutional: She is oriented to person, place, and time. She appears well-developed and well-nourished. No distress.  HENT:  Head: Normocephalic and atraumatic.  Nose: Nose normal.  Mouth/Throat: Oropharynx is clear and moist.  Cardiovascular: Normal rate and intact distal pulses.  No murmur heard. Pulmonary/Chest: Effort normal and breath sounds normal. No respiratory distress.  Abdominal: Soft. There is no tenderness.  Musculoskeletal: She exhibits no tenderness.       Legs: Normal sensation, strength, and pulse distal to area of injury.  Neurological: She is alert and oriented to person, place, and time. No sensory deficit. She exhibits normal muscle tone.  Skin: Capillary refill takes less than 2 seconds. No rash noted. She is not diaphoretic. No pallor.  Nursing note and vitals reviewed.    ED Treatments / Results  Labs (all labs ordered are listed, but only abnormal results are displayed) Labs Reviewed - No data to display  EKG None  Radiology No results found.  Procedures Procedures (including critical care time)  Medications Ordered in ED Medications - No data to display   Initial Impression / Assessment and Plan / ED Course  I have reviewed the triage vital signs and the nursing notes.  Pertinent labs & imaging results that were available during my care of the patient were reviewed by me and considered in my medical decision making (see chart for details).     Wendy Mccormick is a 84 y.o. female with a past medical history significant for atrial fibrillation on Eliquis therapy, hypertension, and GERD who presents with a left shin injury.  Patient reports that she was working outside when she bumped into a picnic table causing a skin tear to her left shin.  She reports that happened several hours ago.  It is continued to have mild bleeding and soaking through her bandages.  She denies any numbness, tingling, or weakness of lower  extremities.  She denies any other injuries.  She is up-to-date with her tetanus vaccination.  She denied other complaints and reports minimal pain.  On my exam, patient is a 2 cm skin tear.  There is oozing bleeding.  No pulsatile bleeding.  No significant crepitance.  No bony tenderness.  Normal sensation and strength and pulse distal to the injury.  Knee nontender.  Exam otherwise unremarkable.  Patient had her wound cleaned and dressed with combat gauze to help it stop bleeding and then a pressure dressing was placed.  Patient was observed for period of time with no further bleeding.  Patient will keep the bandage in place for the next 24 hours and follow-up with her PCP.  Patient wound was not amenable to sutures given the skin tear nature of it.  Patient  understands watching for infection as well as PCP follow-up.  Patient had no other questions or concerns and was discharged in good condition.    Final Clinical Impressions(s) / ED Diagnoses   Final diagnoses:  Skin tear of left lower leg without complication, initial encounter    ED Discharge Orders    None      Clinical Impression: 1. Skin tear of left lower leg without complication, initial encounter     Disposition: Discharge  Condition: Good  I have discussed the results, Dx and Tx plan with the pt(& family if present). He/she/they expressed understanding and agree(s) with the plan. Discharge instructions discussed at great length. Strict return precautions discussed and pt &/or family have verbalized understanding of the instructions. No further questions at time of discharge.    New Prescriptions   No medications on file    Follow Up: Cheral Bay, MD [address] [place] [address] [phone]     [facility] [place] [facility] [id number] [place] [address] [phone]       Yomayra Tate, Canary Brim, MD 06/06/17 2022

## 2017-06-06 NOTE — ED Triage Notes (Signed)
Laceration to her left lower leg. She hit the corner of a picnic table. Bleeding controlled with pressure. She does take Plavix.

## 2017-06-06 NOTE — Discharge Instructions (Signed)
Please keep the bandage on her leg for the next 24 hours.  We found a skin tear on her leg that bled because of your Plavix use.  As you are up-to-date with your tetanus vaccination and otherwise appeared well, we did not feel you need any further work-up at this time.  Please keep the bandage dry.  Please follow-up with your primary doctor for reassessment and further management.  Please watch for infection.  If any symptoms change or worsen, please return to the nearest emergency department.

## 2017-07-21 IMAGING — CT CT HEAD W/O CM
1 series · 16 of 30 positions shown, 20 images · non-contrast
Comparison: None.

CLINICAL DATA: Headache and fever.  Concern for meningitis.

EXAM:
CT HEAD WITHOUT CONTRAST
TECHNIQUE: Contiguous axial images were obtained from the base of the skull
through the vertex without intravenous contrast.

[Series 2: head wo · axial · 0.44mm/px · z∈[-155,-24]mm · 16 of 30 slices shown, 20 images]
[im 2/30  brain]
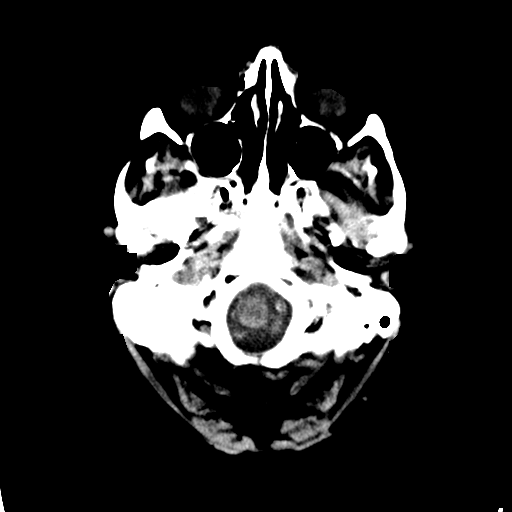
[im 2/30  bone]
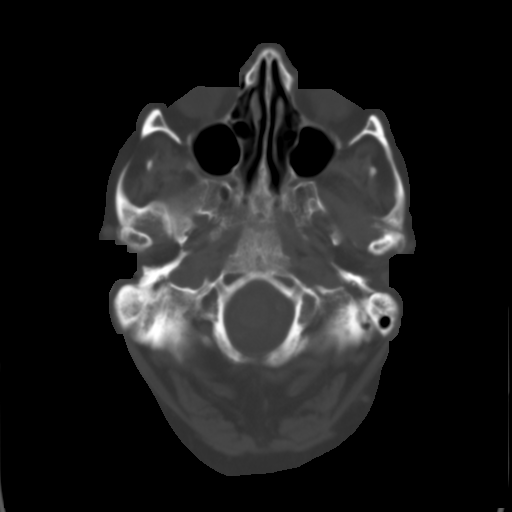
[im 4/30  brain]
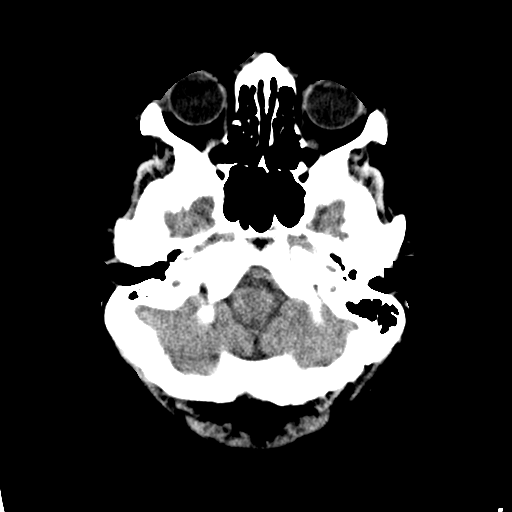
[im 6/30  brain]
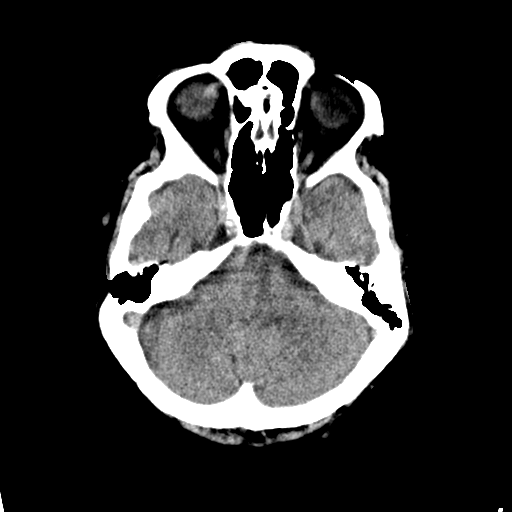
[im 8/30  brain]
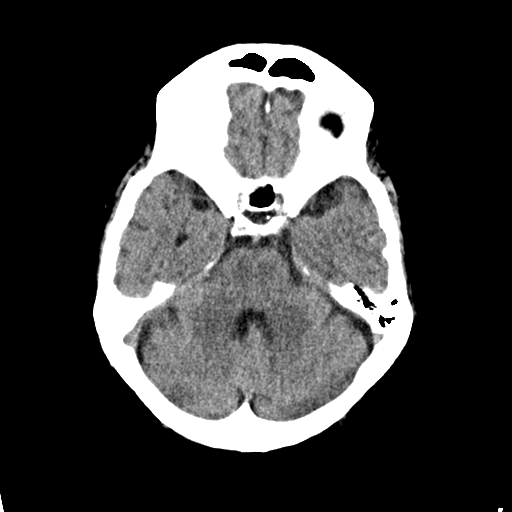
[im 9/30  brain]
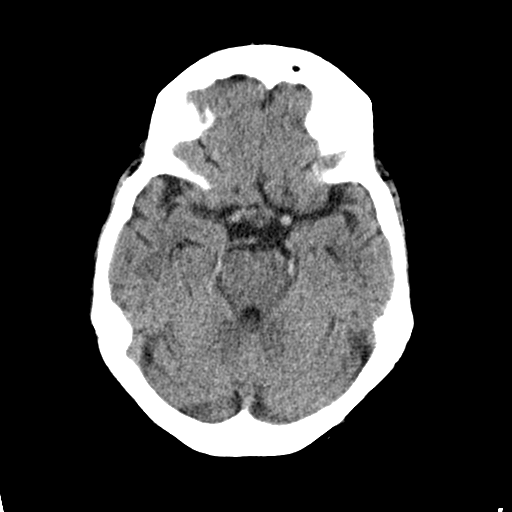
[im 9/30  bone]
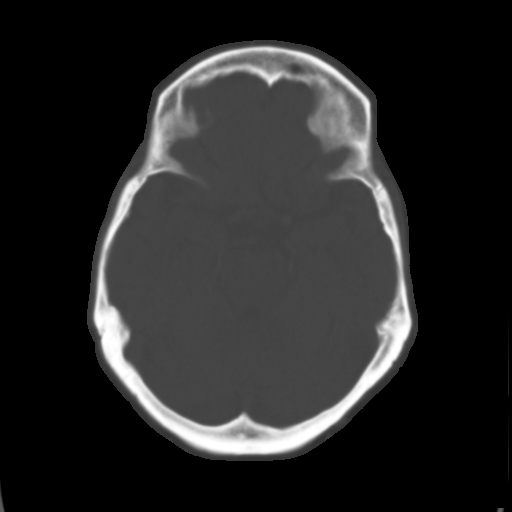
[im 11/30  brain]
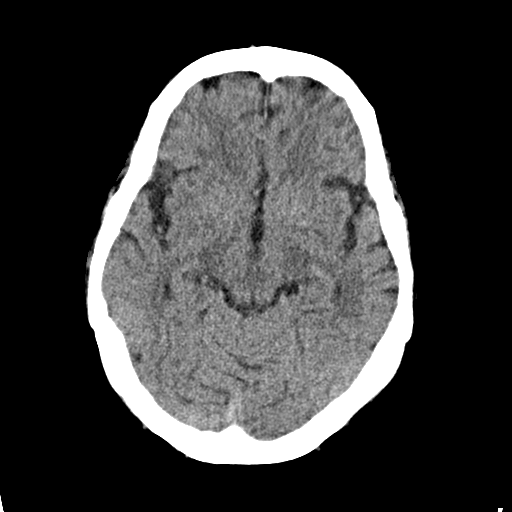
[im 13/30  brain]
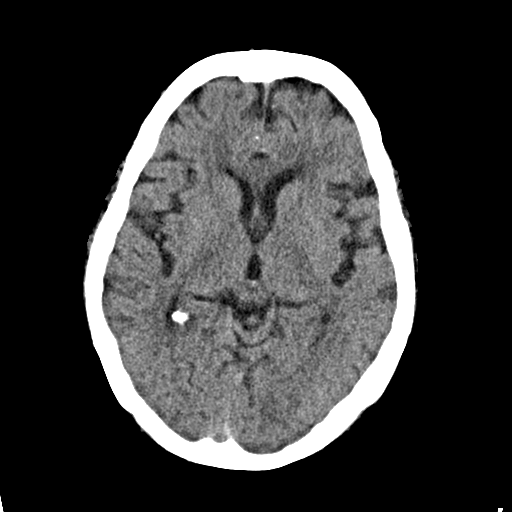
[im 15/30  brain]
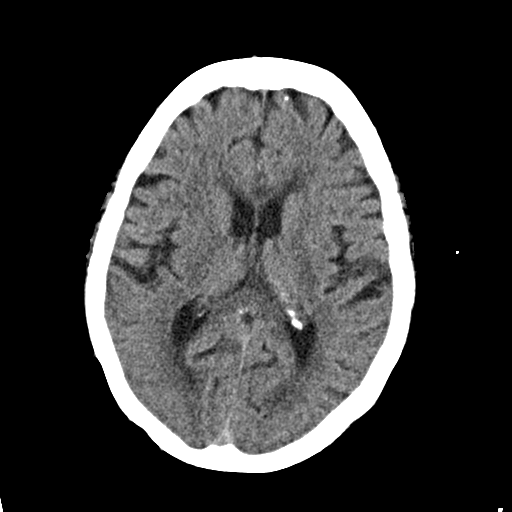
[im 16/30  brain]
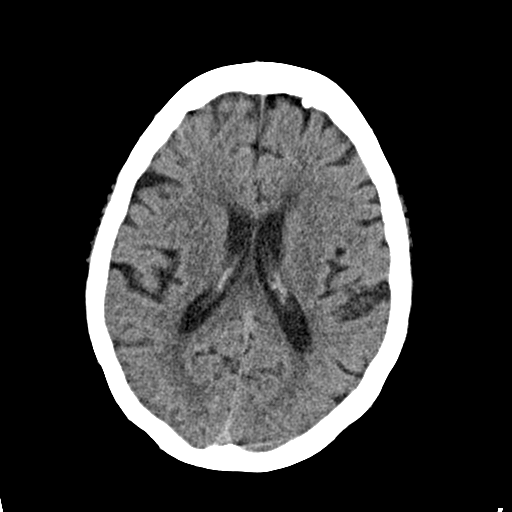
[im 16/30  bone]
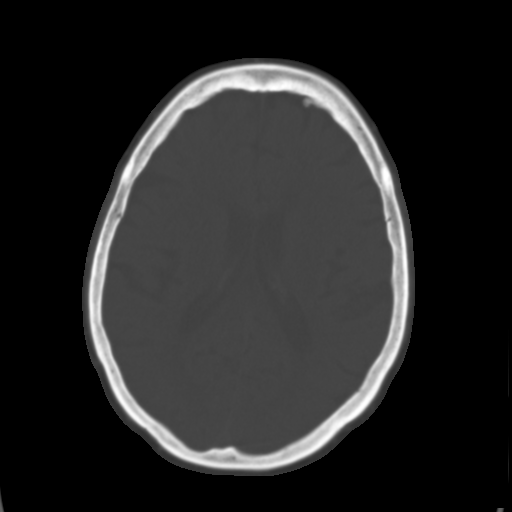
[im 18/30  brain]
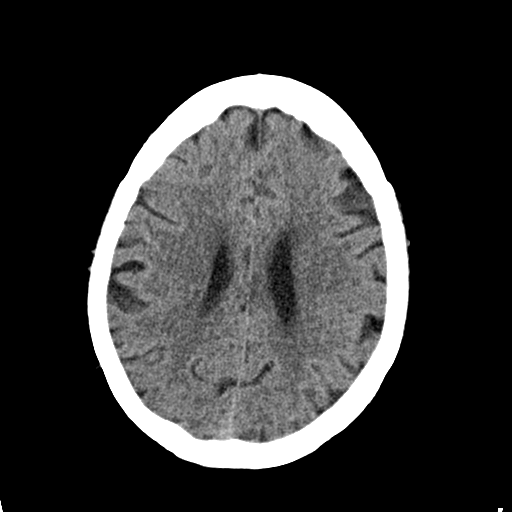
[im 20/30  brain]
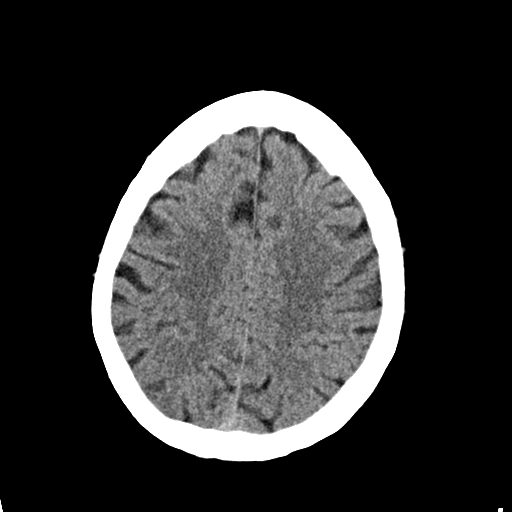
[im 22/30  brain]
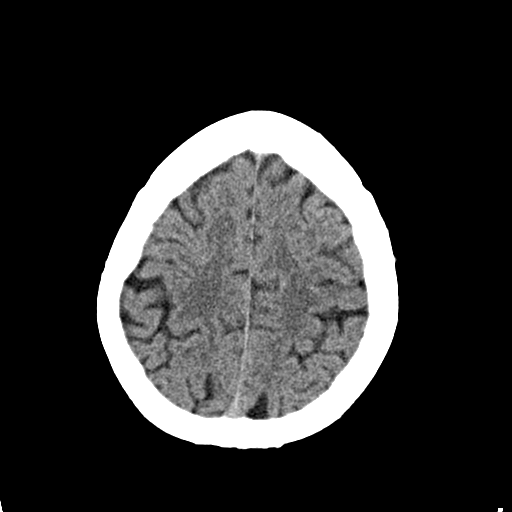
[im 23/30  brain]
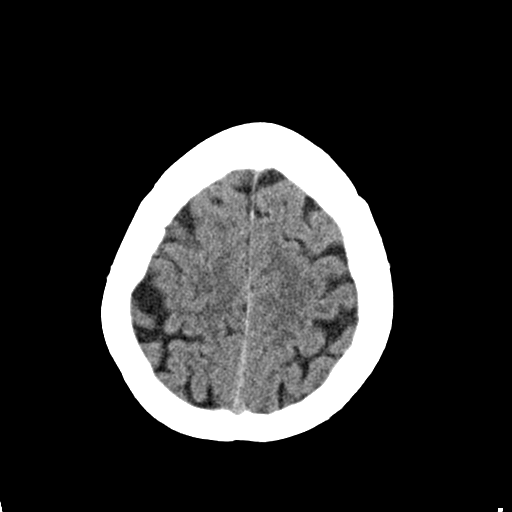
[im 23/30  bone]
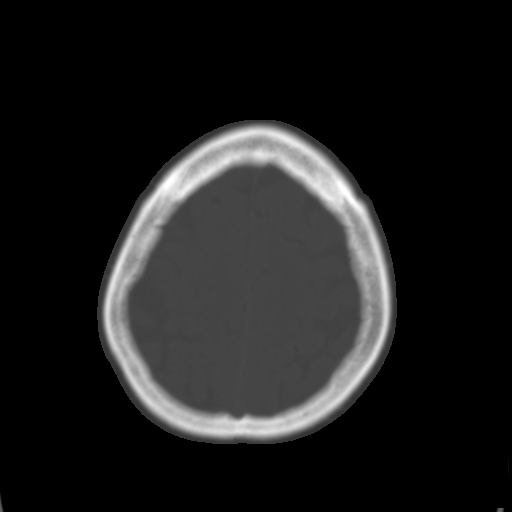
[im 25/30  brain]
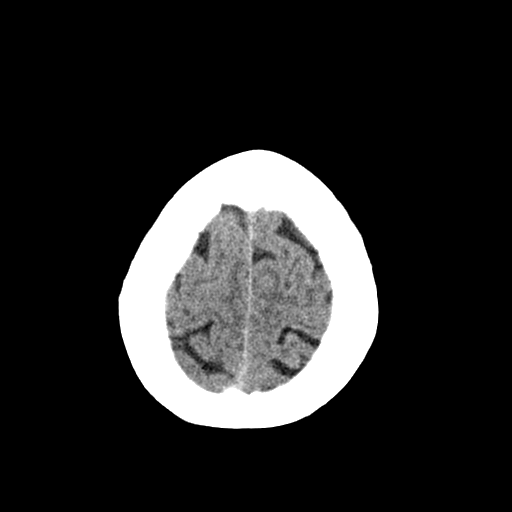
[im 27/30  brain]
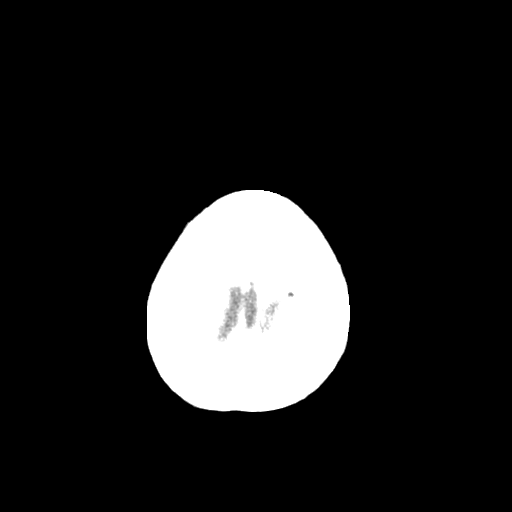
[im 29/30  brain]
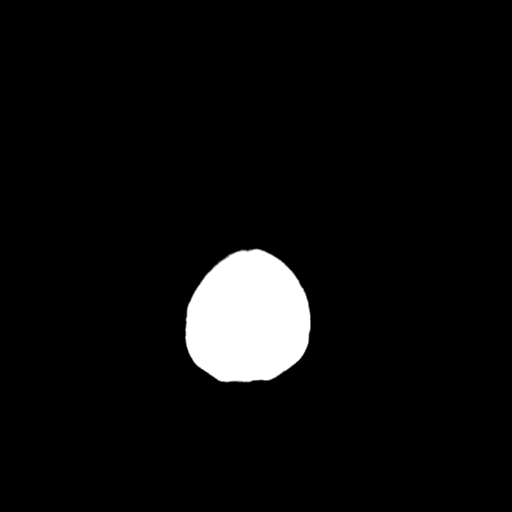

[16 of 30 positions shown; findings below may reference images not displayed]

FINDINGS: Skull and Sinuses:Negative for fracture or destructive process. No
acute sinusitis or mastoiditis where visualized. TMJ osteoarthritis.

Visualized orbits: Bilateral cataract resection.  No acute finding.

Brain: No evidence of acute infarction, hemorrhage, hydrocephalus,
or mass lesion/mass effect. No evidence of sulcal or ventricular
debris. Age congruent small-vessel ischemic change in the deep
cerebral white matter.
IMPRESSION: Unremarkable head CT for age.
